# Patient Record
Sex: Male | Born: 1980 | Race: Black or African American | Hispanic: No | Marital: Married | Smoking: Former smoker
Health system: Southern US, Community
[De-identification: ages and names within clinical notes are randomized; demographics above are authoritative.]

## PROBLEM LIST (undated history)

## (undated) HISTORY — PX: NO PAST SURGERIES: SHX2092

---

## 2006-04-07 ENCOUNTER — Emergency Department (HOSPITAL_COMMUNITY): Admission: EM | Admit: 2006-04-07 | Discharge: 2006-04-07 | Payer: Self-pay | Admitting: Emergency Medicine

## 2011-04-29 ENCOUNTER — Encounter (HOSPITAL_COMMUNITY): Payer: Self-pay

## 2011-04-29 ENCOUNTER — Emergency Department (HOSPITAL_COMMUNITY)
Admission: EM | Admit: 2011-04-29 | Discharge: 2011-04-29 | Disposition: A | Discharge status: Home / Self Care | Payer: 59 | Source: Home / Self Care | Attending: Emergency Medicine | Admitting: Emergency Medicine

## 2011-04-29 DIAGNOSIS — G43909 Migraine, unspecified, not intractable, without status migrainosus: Secondary | ICD-10-CM

## 2011-04-29 DIAGNOSIS — A0811 Acute gastroenteropathy due to Norwalk agent: Secondary | ICD-10-CM

## 2011-04-29 MED ORDER — SUMATRIPTAN SUCCINATE 50 MG PO TABS: 50.0000 mg | ORAL_TABLET | ORAL | Status: AC | PRN

## 2011-04-29 MED ORDER — ONDANSETRON 4 MG PO TBDP
ORAL_TABLET | ORAL | Status: AC
  Filled 2011-04-29: qty 2

## 2011-04-29 MED ORDER — ONDANSETRON 4 MG PO TBDP
8.0000 mg | ORAL_TABLET | Freq: Once | ORAL | Status: AC
  Administered 2011-04-29: 8 mg via ORAL

## 2011-04-29 MED ORDER — DIPHENOXYLATE-ATROPINE 2.5-0.025 MG PO TABS: 1.0000 | ORAL_TABLET | Freq: Four times a day (QID) | ORAL | Status: AC | PRN

## 2011-04-29 MED ORDER — ONDANSETRON 8 MG PO TBDP: 8.0000 mg | ORAL_TABLET | Freq: Three times a day (TID) | ORAL | Status: AC | PRN

## 2011-04-29 NOTE — Discharge Instructions (Signed)

## 2011-04-29 NOTE — ED Notes (Signed)
Pt. C/o N/V since yesterday PM.  States his son had a stomach virus last week. Reports vomiting X3 with low grade fever. Been drinking fluids and took zofran today at 0400. States he has generalized body aches and HA.

## 2011-04-29 NOTE — ED Provider Notes (Signed)
Chief Complaint  Patient presents with  . Nausea    History of Present Illness:   The patient is a 31 year old male who since this morning has had nausea, vomiting, diarrhea, and moderate, crampy, periumbilical pain, has felt feverish, and chills. He denies any blood in the vomitus or stool. No bilious emesis. No specific exposures, suspicious ingestions, foreign travel, or animal exposure.  Review of Systems:  Other than noted above, the patient denies any of the following symptoms: Systemic:  No fevers, chills, sweats, weight loss or gain, fatigue, or tiredness. ENT:  No nasal congestion, rhinorrhea, or sore throat. Lungs:  No cough, wheezing, or shortness of breath. Cardiac:  No chest pain, syncope, or presyncope. GI:  No abdominal pain, nausea, vomiting, anorexia, diarrhea, constipation, blood in stool or vomitus. GU:  No dysuria, frequency, or urgency.  PMFSH:  Past medical history, family history, social history, meds, and allergies were reviewed.  Physical Exam:   Vital signs:  BP 104/60  Pulse 98  Temp(Src) 98.4 F (36.9 C) (Oral)  Resp 14  SpO2 98% General:  Alert and oriented.  In no distress.  Skin warm and dry.  Good skin turgor, brisk capillary refill. ENT:  No scleral icterus, moist mucous membranes, no oral lesions, pharynx clear. Lungs:  Breath sounds clear and equal bilaterally.  No wheezes, rales, or rhonchi. Heart:  Rhythm regular, without extrasystoles.  No gallops or murmers. Abdomen: Soft, flat, nontender, nondistended. No organomegaly or mass. Bowel sounds were normally active. No tenderness, guarding, or rebound. Skin: Clear, warm, and dry.  Assessment:   Diagnoses that have been ruled out:  None  Diagnoses that are still under consideration:  None  Final diagnoses:  Gastroenteritis due to norovirus  Migraines    Plan:   1.  The following meds were prescribed:   New Prescriptions   DIPHENOXYLATE-ATROPINE (LOMOTIL) 2.5-0.025 MG PER TABLET    Take 1  tablet by mouth 4 (four) times daily as needed for diarrhea or loose stools.   ONDANSETRON (ZOFRAN ODT) 8 MG DISINTEGRATING TABLET    Take 1 tablet (8 mg total) by mouth every 8 (eight) hours as needed for nausea.   SUMATRIPTAN (IMITREX) 50 MG TABLET    Take 1 tablet (50 mg total) by mouth every 2 (two) hours as needed for migraine.   2.  The patient was instructed in symptomatic care and handouts were given. 3.  The patient was told to return if becoming worse in any way, if no better in 2 or 3 days, and given some red flag symptoms that would indicate earlier return. 4.  The patient was told to take only sips of clear liquids for the next 24 hours and then advance to a b.r.a.t. Diet.      Reuben Likes, MD 04/29/11 2227

## 2011-04-29 NOTE — ED Notes (Signed)
Prescription given with discharge instructions.  

## 2015-10-18 ENCOUNTER — Emergency Department (HOSPITAL_BASED_OUTPATIENT_CLINIC_OR_DEPARTMENT_OTHER): Payer: 59

## 2015-10-18 ENCOUNTER — Emergency Department (HOSPITAL_BASED_OUTPATIENT_CLINIC_OR_DEPARTMENT_OTHER)
Admission: EM | Admit: 2015-10-18 | Discharge: 2015-10-18 | Disposition: A | Discharge status: Home / Self Care | Payer: 59 | Attending: Emergency Medicine | Admitting: Emergency Medicine

## 2015-10-18 ENCOUNTER — Encounter (HOSPITAL_BASED_OUTPATIENT_CLINIC_OR_DEPARTMENT_OTHER): Payer: Self-pay | Admitting: Respiratory Therapy

## 2015-10-18 DIAGNOSIS — Z87891 Personal history of nicotine dependence: Secondary | ICD-10-CM | POA: Diagnosis not present

## 2015-10-18 DIAGNOSIS — J111 Influenza due to unidentified influenza virus with other respiratory manifestations: Secondary | ICD-10-CM

## 2015-10-18 DIAGNOSIS — J069 Acute upper respiratory infection, unspecified: Secondary | ICD-10-CM | POA: Diagnosis not present

## 2015-10-18 DIAGNOSIS — J988 Other specified respiratory disorders: Secondary | ICD-10-CM

## 2015-10-18 DIAGNOSIS — R69 Illness, unspecified: Secondary | ICD-10-CM

## 2015-10-18 DIAGNOSIS — R509 Fever, unspecified: Secondary | ICD-10-CM | POA: Diagnosis present

## 2015-10-18 DIAGNOSIS — J45909 Unspecified asthma, uncomplicated: Secondary | ICD-10-CM | POA: Diagnosis not present

## 2015-10-18 DIAGNOSIS — B9789 Other viral agents as the cause of diseases classified elsewhere: Secondary | ICD-10-CM

## 2015-10-18 MED ORDER — ALBUTEROL SULFATE HFA 108 (90 BASE) MCG/ACT IN AERS
2.0000 | INHALATION_SPRAY | Freq: Once | RESPIRATORY_TRACT | Status: AC
  Administered 2015-10-18: 2 via RESPIRATORY_TRACT
  Filled 2015-10-18: qty 6.7

## 2015-10-18 MED ORDER — IBUPROFEN 200 MG PO TABS
600.0000 mg | ORAL_TABLET | Freq: Once | ORAL | Status: AC
  Administered 2015-10-18: 600 mg via ORAL
  Filled 2015-10-18: qty 1

## 2015-10-18 NOTE — ED Triage Notes (Signed)
Flu like symptoms since Monday. Fever, body aches, cough. Took robitussin and mucinex at 730 this morning.

## 2015-10-18 NOTE — ED Provider Notes (Signed)
[facility] Provider Note   CSN: [id number] Arrival date & time: 10/18/15  0859     History   Chief Complaint Chief Complaint  Patient presents with  . Fever    HPI Jeremy Cooley is a 35 y.o. male.  HPI  35 year old male with history of asthma presenting with a flulike illness. 2 nights ago he developed congestion, body aches, cough. Had a sore throat yesterday but none now. Has been taking Robitussin and Mucinex. This morning had a fever in the middle the night. He is also having some shortness of breath. He does not typically have frequent asthma exacerbations except during the winter. There is no headache or chest pain. No neck pain. Cough has brown sputum.  Past Medical History:  Diagnosis Date  . Asthma     There are no active problems to display for this patient.   History reviewed. No pertinent surgical history.     Home Medications    Prior to Admission medications   Not on File    Family History Family History  Problem Relation Age of Onset  . Diabetes Other   . Hypertension Other     Social History Social History  Substance Use Topics  . Smoking status: Former Games developermoker  . Smokeless tobacco: Never Used  . Alcohol use 1.8 oz/week    3 Cans of beer per week     Comment: occasional     Allergies   Review of patient's allergies indicates no known allergies.   Review of Systems Review of Systems  Constitutional: Positive for fever.  HENT: Positive for congestion and sore throat.   Respiratory: Positive for cough and shortness of breath.   Cardiovascular: Negative for chest pain.  Gastrointestinal: Negative for vomiting.  Musculoskeletal: Positive for myalgias.  Neurological: Negative for headaches.  All other systems reviewed and are negative.    Physical Exam Updated Vital Signs BP 131/96 (BP Location: Right Arm)   Pulse 96   Temp 99.7 F (37.6 C)   Resp 16   Ht 5\' 10"  (1.778 m)   Wt 210 lb (95.3 kg)   SpO2 97%   BMI  30.13 kg/m   Physical Exam  Constitutional: He is oriented to person, place, and time. He appears well-developed and well-nourished.  HENT:  Head: Normocephalic and atraumatic.  Right Ear: Tympanic membrane and external ear normal.  Left Ear: Tympanic membrane and external ear normal.  Nose: Nose normal.  Mouth/Throat: Oropharynx is clear and moist.  Eyes: Right eye exhibits no discharge. Left eye exhibits no discharge.  Neck: Neck supple.  Cardiovascular: Normal rate, regular rhythm and normal heart sounds.   Pulmonary/Chest: Effort normal and breath sounds normal.  Faint intermittent inspiratory wheezes  Abdominal: He exhibits no distension.  Musculoskeletal: He exhibits no edema.  Neurological: He is alert and oriented to person, place, and time.  Skin: Skin is warm and dry.  Nursing note and vitals reviewed.    ED Treatments / Results  Labs (all labs ordered are listed, but only abnormal results are displayed) Labs Reviewed - No data to display  EKG  EKG Interpretation None       Radiology Dg Chest 2 View  Result Date: 10/18/2015 CLINICAL DATA:  Cough since Monday.  Fever. EXAM: CHEST  2 VIEW COMPARISON:  None. FINDINGS: Heart and mediastinal contours are within normal limits. No focal opacities or effusions. No acute bony abnormality. IMPRESSION: No active cardiopulmonary disease. Electronically Signed   By: Charlett NoseKevin  Dover M.D.  On: 10/18/2015 09:39    Procedures Procedures (including critical care time)  Medications Ordered in ED Medications  ibuprofen (ADVIL,MOTRIN) tablet 600 mg (600 mg Oral Given 10/18/15 0916)  albuterol (PROVENTIL HFA;VENTOLIN HFA) 108 (90 Base) MCG/ACT inhaler 2 puff (2 puffs Inhalation Given 10/18/15 0941)     Initial Impression / Assessment and Plan / ED Course  I have reviewed the triage vital signs and the nursing notes.  Pertinent labs & imaging results that were available during my care of the patient were reviewed by me and  considered in my medical decision making (see chart for details).  Clinical Course  Comment By Time  Overall patient appears well. No signs of distress. Will get Xray to eval for PNA.  Pricilla Loveless, MD 09/13 [phone]    X-ray shows no pneumonia. Likely has a flulike or other viral respiratory illness. However he does not have a severe history of asthma or other concerning medical problems that would warrant Tamiflu. Discussed over-the-counter treatments and supportive care, given an albuterol inhaler given very mild wheezing and no albuterol at home. Discussed strict return precautions.  Final Clinical Impressions(s) / ED Diagnoses   Final diagnoses:  Influenza-like illness  Viral respiratory illness    New Prescriptions There are no discharge medications for this patient.    Pricilla Loveless, MD 10/18/15 1225

## 2016-06-27 ENCOUNTER — Encounter: Payer: Self-pay | Admitting: Allergy

## 2016-06-27 ENCOUNTER — Ambulatory Visit (INDEPENDENT_AMBULATORY_CARE_PROVIDER_SITE_OTHER): Payer: 59 | Admitting: Allergy

## 2016-06-27 VITALS — BP 132/84 | HR 85 | Temp 98.2°F | Ht 70.0 in | Wt 231.0 lb

## 2016-06-27 DIAGNOSIS — H1010 Acute atopic conjunctivitis, unspecified eye: Secondary | ICD-10-CM

## 2016-06-27 DIAGNOSIS — J309 Allergic rhinitis, unspecified: Secondary | ICD-10-CM

## 2016-06-27 DIAGNOSIS — J4550 Severe persistent asthma, uncomplicated: Secondary | ICD-10-CM

## 2016-06-27 MED ORDER — BUDESONIDE-FORMOTEROL FUMARATE 160-4.5 MCG/ACT IN AERO
2.0000 | INHALATION_SPRAY | Freq: Two times a day (BID) | RESPIRATORY_TRACT | 5 refills | Status: AC
Start: 2016-06-27 — End: ?

## 2016-06-27 MED ORDER — ALBUTEROL SULFATE HFA 108 (90 BASE) MCG/ACT IN AERS: INHALATION_SPRAY | RESPIRATORY_TRACT | 2 refills | Status: DC

## 2016-06-27 MED ORDER — OLOPATADINE HCL 0.7 % OP SOLN: 1.0000 [drp] | Freq: Every day | OPHTHALMIC | 5 refills | Status: DC | PRN

## 2016-06-27 NOTE — Progress Notes (Signed)
New Patient Note  RE: Jeremy Cooley MRN: [id number] DOB: 1980/12/02 Date of Office Visit: 06/27/2016  Referring provider: No ref. provider found Primary care provider: Patient, No Pcp Per  Chief Complaint: Trouble breathing  History of present illness: Jeremy Cooley is a 36 y.o. male presenting today for evaluation of trouble beathing.  He has a history of asthma diagnosed when he was a teenager.  He currently feels like he has been having an asthma flare up for the past couple of months.  He states he had the flu last fall and he hasn't seemed to recover from that.  He continues to have cough and wheezing that is worse at night and he does endorse nighttime awakenings most nights of the week. He has been using his albuterol at least daily for relief of symptoms. He has not been to urgent care or ED for these symptoms and has not been on any oral steroids. He reports that he has been using his albuterol so much that he ran out and has had to use his son's albuterol. He does recall as a child that his asthma was triggered by cold weather and he was on Advair discus. He feels it has been at least 10 years or so since he last used any controller medications.  He reports seasonal allergies worse in spring and summer.  He reports nasal congestion and draining as well as itchy, watery red eyes and sneezing.   He takes zyrtec as needed.    He reports sneezing is his biggest allergy complaint.    No history of eczema or food allergy.  He does not have a primary care provider at this time .  Review of systems: Review of Systems  Constitutional: Negative for chills, fever and malaise/fatigue.  HENT: Positive for congestion. Negative for ear discharge, ear pain, nosebleeds, sinus pain, sore throat and tinnitus.   Eyes: Positive for redness. Negative for pain and discharge.  Respiratory: Positive for cough, shortness of breath and wheezing. Negative for sputum production.   Cardiovascular: Negative  for chest pain.  Gastrointestinal: Negative for abdominal pain, diarrhea, heartburn, nausea and vomiting.  Musculoskeletal: Negative for joint pain and myalgias.  Skin: Negative for itching and rash.  Neurological: Negative for headaches.    All other systems negative unless noted above in HPI  Past medical history: Past Medical History:  Diagnosis Date  . Asthma     Past surgical history: Past Surgical History:  Procedure Laterality Date  . NO PAST SURGERIES      Family history:  Family History  Problem Relation Age of Onset  . Diabetes Other   . Hypertension Other   . Allergic rhinitis Father   . Asthma Father   . Angioedema Neg Hx   . Atopy Neg Hx   . Eczema Neg Hx   . Immunodeficiency Neg Hx   . Urticaria Neg Hx     Social history: He lives in a home with carpeting with gas heating and central cooling. There is a dog in the home. There is no concern for water damage, mildew or roaches in the home. He works as a Research officer, trade unionsenior provider data analyst  . Smoking status: Former Games developermoker  . Smokeless tobacco: Never Used  . Alcohol use 1.8 oz/week    3 Cans of beer per week     Comment: occasional  . Drug use: No   Medication List: Allergies as of 06/27/2016   No Known Allergies     Medication  List       Accurate as of 06/27/16 12:17 PM. Always use your most recent med list.          albuterol 108 (90 Base) MCG/ACT inhaler Commonly known as:  PROVENTIL HFA;VENTOLIN HFA Inhale into the lungs every 6 (six) hours as needed for wheezing or shortness of breath.   cetirizine 10 MG tablet Commonly known as:  ZYRTEC Take 10 mg by mouth daily.       Known medication allergies: No Known Allergies   Physical examination: Blood pressure 132/84, pulse 85, temperature 98.2 F (36.8 C), temperature source Oral, height 5\' 10"  (1.778 m), weight 231 lb (104.8 kg), SpO2 96 %.  General: Alert, interactive, in no acute distress. HEENT: TMs pearly gray, turbinates moderately  edematous with clear discharge, post-pharynx non erythematous. Neck: Supple without lymphadenopathy. Lungs: Mildly decreased breath sounds with expiratory wheezing bilaterally. {no increased work of breathing.  Decreased wheezing with improved aeration following albuterol neb treatment CV: Normal S1, S2 without murmurs. Abdomen: Nondistended, nontender. Skin: Warm and dry, without lesions or rashes. Extremities:  No clubbing, cyanosis or edema. Neuro:   Grossly intact.  Diagnositics/Labs: Labs/imaging: personally reviewed CXR from 10/2015 that was normal.   FINDINGS: Heart and mediastinal contours are within normal limits. No focal opacities or effusions. No acute bony abnormality.  IMPRESSION: No active cardiopulmonary disease  Spirometry: FEV1: 2.5L  68%, FVC: 4.32L  97% he had a 14% increase in FEV1 to 2.66 L or 77% following bronchodilator. This is a significant improvement .    Allergy testing: Declined    Assessment and plan:   Asthma, Severe persistent     - start Symbicort 2 puffs twice a day.  This is your everyday maintenance medication     - use albuterol 2 puffs every 4-6 hours a needed for cough, wheeze, shortness of breath or chest tightness.   Monitor frequency of use.    Asthma control goals:   Full participation in all desired activities (may need albuterol before activity)  Albuterol use two time or less a week on average (not counting use with activity)  Cough interfering with sleep two time or less a month  Oral steroids no more than once a year  No hospitalizations   Allergic rhinoconjunctivitis    - take Zyrtec 10mg  daily     - for nasal congestion/drainage use over-the-counter nasal steroid spray 2 spray each nostril daily    - for itchy, watery, red eyes use Pazeo 1 drop each eye daily as needed    - Recommend testing in the future  Follow-up 3 months or sooner if needed   I appreciate the opportunity to take part in Jeremy Cooley's care.  Please do not hesitate to contact me with questions.  Sincerely,   Margo Aye, MD Allergy/Immunology [facility]

## 2016-06-27 NOTE — Patient Instructions (Signed)
Asthma     - start Symbicort 160mcg 2 puffs twice a day.  This is your everyday maintenance medication     - use albuterol 2 puffs every 4-6 hours a needed for cough, wheeze, shortness of breath or chest tightness.   Monitor frequency of use.    Asthma control goals:   Full participation in all desired activities (may need albuterol before activity)  Albuterol use two time or less a week on average (not counting use with activity)  Cough interfering with sleep two time or less a month  Oral steroids no more than once a year  No hospitalizations   Allergies    - take Zyrtec 10mg  daily     - for nasal congestion/drainage use over-the-counter nasal steroid spray 2 spray each nostril daily    - for itchy, watery, red eyes use Pazeo 1 drop each eye daily as needed  Follow-up 3 months or sooner if needed

## 2016-06-28 ENCOUNTER — Telehealth: Payer: Self-pay

## 2016-06-28 MED ORDER — OLOPATADINE HCL 0.1 % OP SOLN: OPHTHALMIC | 5 refills | Status: DC

## 2016-06-28 NOTE — Telephone Encounter (Signed)
Script sent  

## 2016-06-28 NOTE — Telephone Encounter (Signed)
Lets change to patanol 1 drop each eye twice daily as needed for itchy, watery, red eyes.   thanks

## 2016-06-28 NOTE — Telephone Encounter (Signed)
Received fax from [facility] in regards to an alternative for Pazeo. The alternatives are lastacaf, Pensions consultantpatanol,or Zaditor. Please advise with instructions.

## 2016-06-28 NOTE — Addendum Note (Signed)
Addended by: Marthann SchillerLIPFORD, JENNIFER C on: 06/28/2016 08:50 AM   Modules accepted: Orders

## 2017-07-17 ENCOUNTER — Ambulatory Visit: Payer: 59 | Admitting: Family Medicine

## 2017-07-17 DIAGNOSIS — Z0289 Encounter for other administrative examinations: Secondary | ICD-10-CM

## 2017-12-05 ENCOUNTER — Emergency Department (HOSPITAL_COMMUNITY): Payer: 59

## 2017-12-05 ENCOUNTER — Other Ambulatory Visit: Payer: Self-pay

## 2017-12-05 ENCOUNTER — Encounter (HOSPITAL_COMMUNITY): Payer: Self-pay | Admitting: Emergency Medicine

## 2017-12-05 ENCOUNTER — Emergency Department (HOSPITAL_COMMUNITY)
Admission: EM | Admit: 2017-12-05 | Discharge: 2017-12-05 | Disposition: A | Discharge status: Home / Self Care | Payer: 59 | Attending: Emergency Medicine | Admitting: Emergency Medicine

## 2017-12-05 DIAGNOSIS — J45909 Unspecified asthma, uncomplicated: Secondary | ICD-10-CM | POA: Insufficient documentation

## 2017-12-05 DIAGNOSIS — Z79899 Other long term (current) drug therapy: Secondary | ICD-10-CM | POA: Insufficient documentation

## 2017-12-05 DIAGNOSIS — Z87891 Personal history of nicotine dependence: Secondary | ICD-10-CM | POA: Insufficient documentation

## 2017-12-05 DIAGNOSIS — R0789 Other chest pain: Secondary | ICD-10-CM

## 2017-12-05 DIAGNOSIS — R079 Chest pain, unspecified: Secondary | ICD-10-CM | POA: Diagnosis present

## 2017-12-05 LAB — I-STAT TROPONIN, ED
TROPONIN I, POC: 0 ng/mL (ref 0.00–0.08)
TROPONIN I, POC: 0 ng/mL (ref 0.00–0.08)

## 2017-12-05 LAB — BASIC METABOLIC PANEL
ANION GAP: 9 (ref 5–15)
BUN: 11 mg/dL (ref 6–20)
CO2: 24 mmol/L (ref 22–32)
Calcium: 9.3 mg/dL (ref 8.9–10.3)
Chloride: 106 mmol/L (ref 98–111)
Creatinine, Ser: 0.93 mg/dL (ref 0.61–1.24)
Glucose, Bld: 110 mg/dL — ABNORMAL HIGH (ref 70–99)
Potassium: 3.7 mmol/L (ref 3.5–5.1)
SODIUM: 139 mmol/L (ref 135–145)

## 2017-12-05 LAB — CBC
HCT: 44.3 % (ref 39.0–52.0)
Hemoglobin: 14.7 g/dL (ref 13.0–17.0)
MCH: 29.5 pg (ref 26.0–34.0)
MCHC: 33.2 g/dL (ref 30.0–36.0)
MCV: 89 fL (ref 80.0–100.0)
NRBC: 0 % (ref 0.0–0.2)
Platelets: 344 10*3/uL (ref 150–400)
RBC: 4.98 MIL/uL (ref 4.22–5.81)
RDW: 13.2 % (ref 11.5–15.5)
WBC: 6.9 10*3/uL (ref 4.0–10.5)

## 2017-12-05 LAB — D-DIMER, QUANTITATIVE (NOT AT ARMC)

## 2017-12-05 MED ORDER — MELOXICAM 15 MG PO TABS: 15.0000 mg | ORAL_TABLET | Freq: Every day | ORAL | 0 refills | Status: DC

## 2017-12-05 NOTE — ED Notes (Signed)
Patient verbalizes understanding of discharge instructions. Opportunity for questioning and answers were provided. Pt discharged from ED. 

## 2017-12-05 NOTE — Discharge Instructions (Addendum)
You have been diagnosed by your caregiver as having chest wall pain. °SEEK IMMEDIATE MEDICAL ATTENTION IF: °You develop a fever.  °Your chest pains become severe or intolerable.  °You develop new, unexplained symptoms (problems).  °You develop shortness of breath, nausea, vomiting, sweating or feel light headed.  °You develop a new cough or you cough up blood. ° °

## 2017-12-05 NOTE — ED Provider Notes (Signed)
[facility] EMERGENCY DEPARTMENT Provider Note   CSN: [id number] Arrival date & time: 12/05/17  4098     History   Chief Complaint Chief Complaint  Patient presents with  . Chest Pain    HPI Jeremy Cooley is a 37 y.o. male.  Who presents the emergency department chief complaint of chest pain.  Patient states that last night he began having pain on the left side of his chest under the left nipple on the anterior chest wall and in the back of his shoulder blade.  Pain was made worse with movement of the left arm.  He states that he had significant difficulty sleeping.  He also complained that the pain was worse when lying flat or on the left side better with sitting upright or lying toward the right side.  He denies any recent heavy lifting or new exercise routine.  The patient does not have a history of high blood pressure, high cholesterol or smoking.  He denies a family history of coronary artery disease or MI.  He denies pleuritic chest pain, hemoptysis, fevers or chills.  He has no unilateral leg swelling, history of DVT, PE recent confinement injuries or surgeries.  HPI  Past Medical History:  Diagnosis Date  . Asthma     There are no active problems to display for this patient.   Past Surgical History:  Procedure Laterality Date  . NO PAST SURGERIES          Home Medications    Prior to Admission medications   Medication Sig Start Date End Date Taking? Authorizing Provider  albuterol (PROVENTIL HFA;VENTOLIN HFA) 108 (90 Base) MCG/ACT inhaler Inhale 2 puffs every 4-6 hours as needed for shortness of breath or wheezing. 06/27/16   Marcelyn Bruins, MD  budesonide-formoterol [facility]) 160-4.5 MCG/ACT inhaler Inhale 2 puffs into the lungs 2 (two) times daily. 06/27/16   Marcelyn Bruins, MD  cetirizine (ZYRTEC) 10 MG tablet Take 10 mg by mouth daily.    [provider]  olopatadine (PATANOL) 0.1 % ophthalmic solution 1 drop in  both eyes twice a day as needed for itchy watery eyes. 06/28/16   Marcelyn Bruins, MD    Family History Family History  Problem Relation Age of Onset  . Diabetes Other   . Hypertension Other   . Allergic rhinitis Father   . Asthma Father   . Angioedema Neg Hx   . Atopy Neg Hx   . Eczema Neg Hx   . Immunodeficiency Neg Hx   . Urticaria Neg Hx     Social History Social History   Tobacco Use  . Smoking status: Former Games developer  . Smokeless tobacco: Never Used  Substance Use Topics  . Alcohol use: Yes    Alcohol/week: 3.0 standard drinks    Types: 3 Cans of beer per week    Comment: occasional  . Drug use: No     Allergies   Patient has no known allergies.   Review of Systems Review of Systems Ten systems reviewed and are negative for acute change, except as noted in the HPI.    Physical Exam Updated Vital Signs BP 112/84 (BP Location: Right Arm)   Pulse 70   Temp 98.4 F (36.9 C) (Oral)   Resp 17   Ht 5\' 10"  (1.778 m)   Wt 104.3 kg   SpO2 98%   BMI 33.00 kg/m   Physical Exam  Constitutional: He appears well-developed and well-nourished. No distress.  HENT:  Head: Normocephalic and atraumatic.  Eyes: Conjunctivae are normal. No scleral icterus.  Neck: Normal range of motion. Neck supple.  Cardiovascular: Normal rate, regular rhythm, normal heart sounds and normal pulses.  Pulmonary/Chest: Effort normal and breath sounds normal. No respiratory distress. He has no wheezes. He has no rhonchi. He has no rales. He exhibits no tenderness.  Abdominal: Soft. There is no tenderness.  Musculoskeletal: He exhibits no edema.  Neurological: He is alert.  Skin: Skin is warm and dry. He is not diaphoretic.  Psychiatric: His behavior is normal.  Nursing note and vitals reviewed.    ED Treatments / Results  Labs (all labs ordered are listed, but only abnormal results are displayed) Labs Reviewed  BASIC METABOLIC PANEL - Abnormal; Notable for the following  components:      Result Value   Glucose, Bld 110 (*)    All other components within normal limits  CBC  I-STAT TROPONIN, ED    EKG EKG Interpretation  Date/Time:  Friday December 05 2017 09:26:27 EDT Ventricular Rate:  82 PR Interval:  140 QRS Duration: 102 QT Interval:  384 QTC Calculation: 448 R Axis:   82 Text Interpretation:  Normal sinus rhythm Normal ECG Confirmed by Tilden Fossa [phone]) on 12/05/2017 9:49:38 AM   Radiology Dg Chest 2 View  Result Date: 12/05/2017 CLINICAL DATA:  37 year old male with left side chest pain radiating to the lower ribs since last night. Symptoms increase with movement. No known injury. EXAM: CHEST - 2 VIEW COMPARISON:  Chest radiographs 10/18/2015. FINDINGS: Mildly lower lung volumes with mild crowding of markings suspected in both lungs. Mediastinal contours remain normal. Visualized tracheal air column is within normal limits. No pneumothorax, pulmonary edema, pleural effusion or confluent pulmonary opacity. No osseous abnormality identified. Negative visible bowel gas pattern IMPRESSION: Lower lung volumes, otherwise negative - no cardiopulmonary abnormality. Electronically Signed   By: Odessa Fleming M.D.   On: 12/05/2017 09:59    Procedures Procedures (including critical care time)  Medications Ordered in ED Medications - No data to display   Initial Impression / Assessment and Plan / ED Course  I have reviewed the triage vital signs and the nursing notes.  Pertinent labs & imaging results that were available during my care of the patient were reviewed by me and considered in my medical decision making (see chart for details).     This is a 37 year old male with chest pain.  Worse with movement of the arm.  Not reproducible with palpation of the chest wall.  Patient's EKG shows normal sinus rhythm without any abnormalities.  His chest x-ray is reviewed by me and shows no abnormalities as well.  He has a heart score of 0.  His d-dimer is  negative.  I feel that his pain is likely chest wall pain and musculoskeletal in nature.  I also consider pericarditis although without diffuse ST segment elevation this may very unlikely however treatment will be the same with anti-inflammatory medications.  Patient appears appropriate for discharge at this time.  I discussed return precautions. Final Clinical Impressions(s) / ED Diagnoses   Final diagnoses:  None    ED Discharge Orders    None       Arthor Captain, PA-C 12/05/17 1617    Tilden Fossa, MD 12/07/17 [phone]

## 2017-12-05 NOTE — ED Triage Notes (Signed)
Pt presents with left lateral side chest pain since yesterday no radiation but when he moves he feels it in his shoulder blade. No shortness or breath noted. He denies cardiac hx. The patient is alert and oriented x4 with no acute distress at triage.

## 2017-12-05 NOTE — ED Notes (Signed)
Lab adding on D dimer to specimens already corrected.

## 2020-02-21 IMAGING — CR DG CHEST 2V
2 series · 2 of 2 positions shown · non-contrast
Comparison: Chest radiographs 10/18/2015.

CLINICAL DATA: 37-year-old male with left side chest pain radiating
to the lower ribs since last night. Symptoms increase with movement.
No known injury.

EXAM:
CHEST - 2 VIEW

[chest pa]
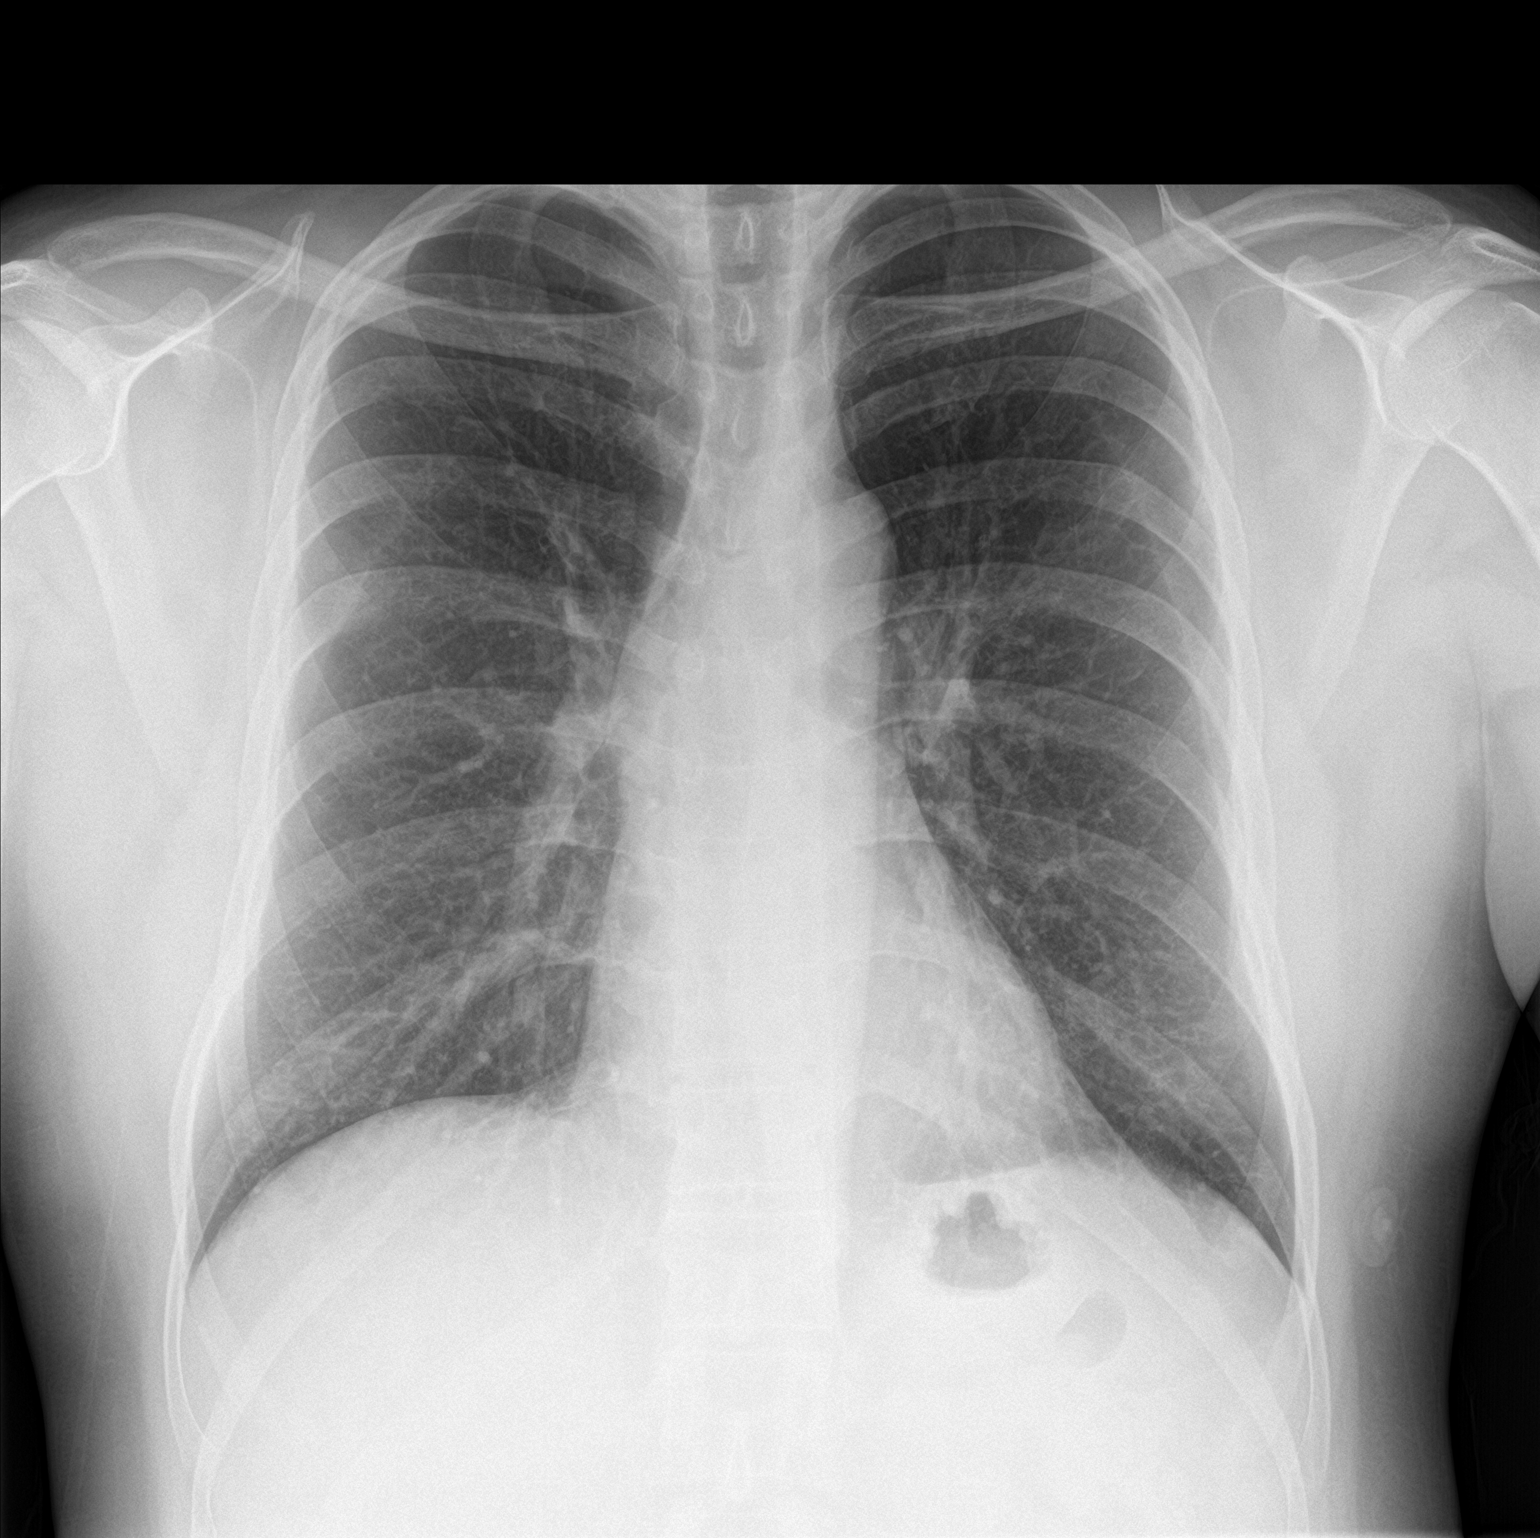

[chest lat]
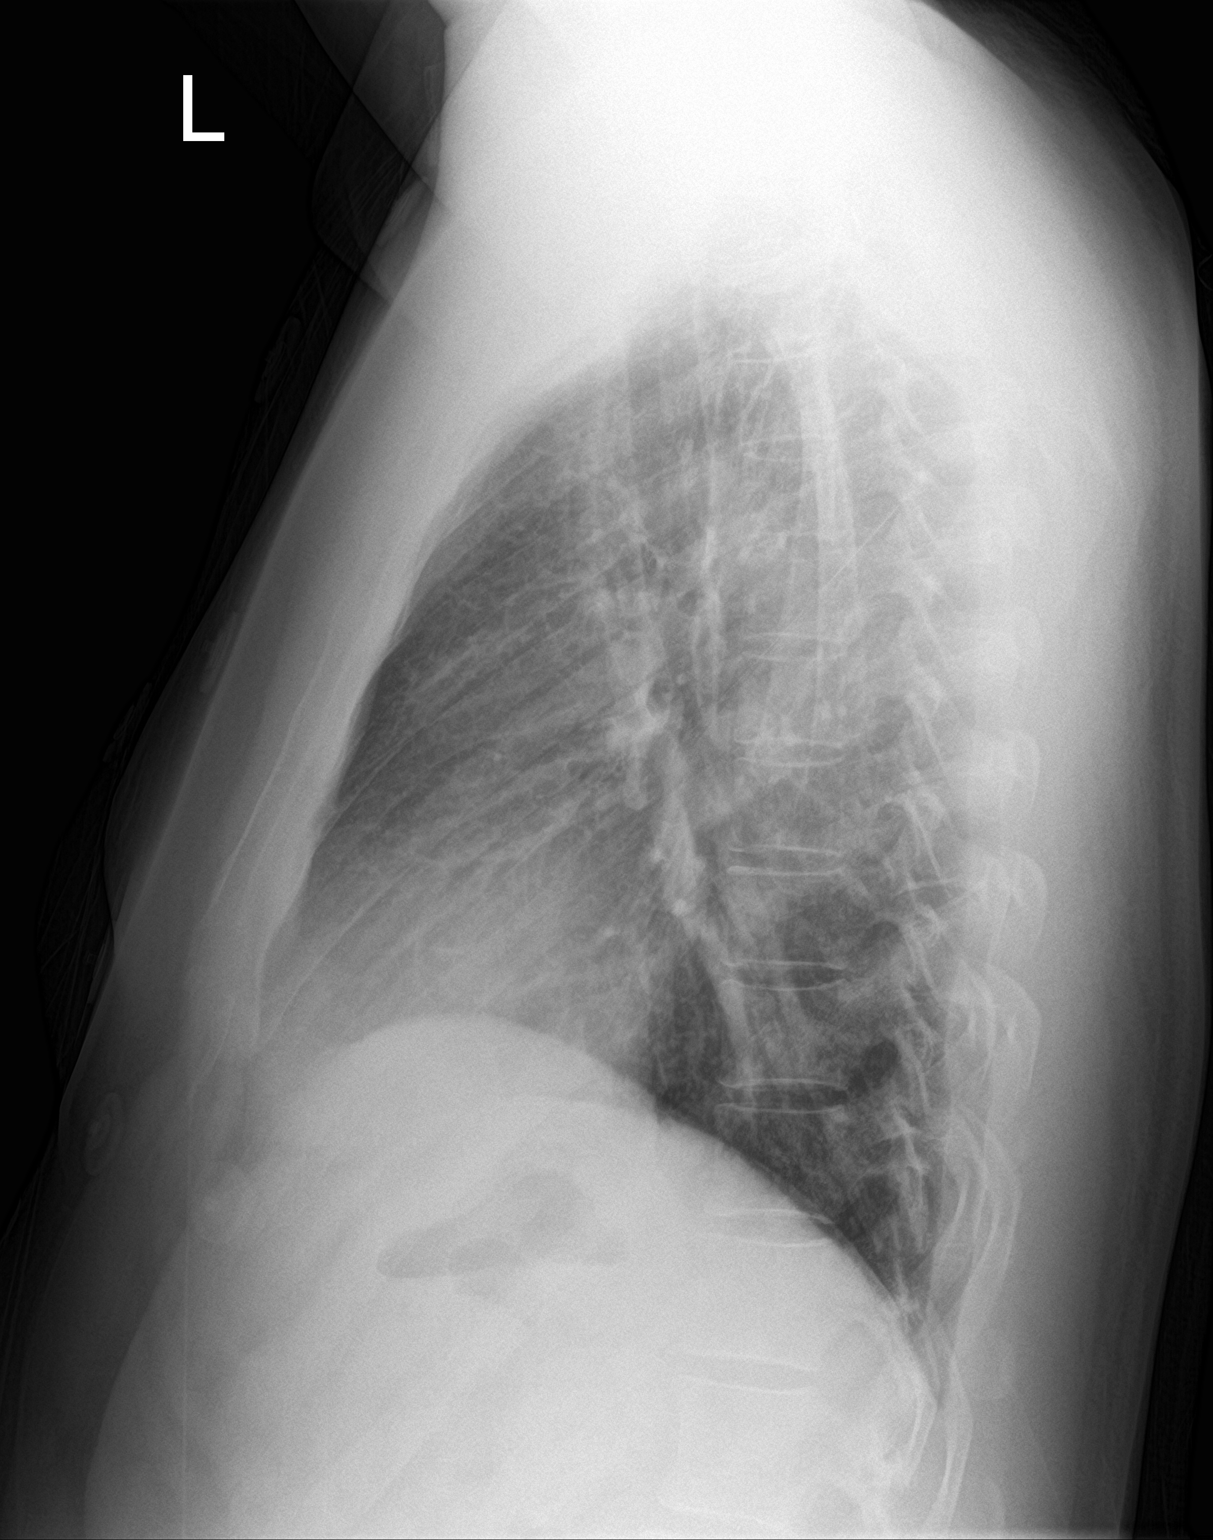

[2 of 2 positions shown; findings below may reference images not displayed]

FINDINGS: Mildly lower lung volumes with mild crowding of markings suspected
in both lungs. Mediastinal contours remain normal. Visualized
tracheal air column is within normal limits. No pneumothorax,
pulmonary edema, pleural effusion or confluent pulmonary opacity. No
osseous abnormality identified. Negative visible bowel gas pattern
IMPRESSION: Lower lung volumes, otherwise negative - no cardiopulmonary
abnormality.

## 2023-07-26 ENCOUNTER — Encounter (HOSPITAL_BASED_OUTPATIENT_CLINIC_OR_DEPARTMENT_OTHER): Payer: Self-pay | Admitting: *Deleted

## 2023-07-26 ENCOUNTER — Other Ambulatory Visit: Payer: Self-pay

## 2023-07-26 ENCOUNTER — Emergency Department (HOSPITAL_BASED_OUTPATIENT_CLINIC_OR_DEPARTMENT_OTHER)
Admission: EM | Admit: 2023-07-26 | Discharge: 2023-07-26 | Disposition: A | Discharge status: Home / Self Care | Source: Home / Self Care | Attending: Emergency Medicine | Admitting: Emergency Medicine

## 2023-07-26 DIAGNOSIS — N3001 Acute cystitis with hematuria: Secondary | ICD-10-CM | POA: Insufficient documentation

## 2023-07-26 DIAGNOSIS — Z87891 Personal history of nicotine dependence: Secondary | ICD-10-CM | POA: Diagnosis not present

## 2023-07-26 DIAGNOSIS — D72829 Elevated white blood cell count, unspecified: Secondary | ICD-10-CM | POA: Insufficient documentation

## 2023-07-26 DIAGNOSIS — B9620 Unspecified Escherichia coli [E. coli] as the cause of diseases classified elsewhere: Secondary | ICD-10-CM | POA: Diagnosis not present

## 2023-07-26 DIAGNOSIS — Z7951 Long term (current) use of inhaled steroids: Secondary | ICD-10-CM | POA: Insufficient documentation

## 2023-07-26 DIAGNOSIS — G43909 Migraine, unspecified, not intractable, without status migrainosus: Secondary | ICD-10-CM | POA: Diagnosis not present

## 2023-07-26 DIAGNOSIS — R509 Fever, unspecified: Secondary | ICD-10-CM | POA: Diagnosis present

## 2023-07-26 DIAGNOSIS — J45909 Unspecified asthma, uncomplicated: Secondary | ICD-10-CM | POA: Insufficient documentation

## 2023-07-26 DIAGNOSIS — N413 Prostatocystitis: Secondary | ICD-10-CM | POA: Diagnosis not present

## 2023-07-26 DIAGNOSIS — F1090 Alcohol use, unspecified, uncomplicated: Secondary | ICD-10-CM | POA: Diagnosis not present

## 2023-07-26 DIAGNOSIS — Z79899 Other long term (current) drug therapy: Secondary | ICD-10-CM | POA: Diagnosis not present

## 2023-07-26 LAB — URINALYSIS, W/ REFLEX TO CULTURE (INFECTION SUSPECTED)
Bilirubin Urine: NEGATIVE
Glucose, UA: NEGATIVE mg/dL
Ketones, ur: 15 mg/dL — AB
Nitrite: NEGATIVE
Protein, ur: 30 mg/dL — AB
RBC / HPF: >50 RBC/hpf (ref 0–5)
Specific Gravity, Urine: 1.031 — ABNORMAL HIGH (ref 1.005–1.030)
WBC, UA: >50 WBC/hpf (ref 0–5)
pH: 6 (ref 5.0–8.0)

## 2023-07-26 LAB — CBC WITH DIFFERENTIAL/PLATELET
Abs Immature Granulocytes: 0.08 10*3/uL — ABNORMAL HIGH (ref 0.00–0.07)
Basophils Absolute: 0 10*3/uL (ref 0.0–0.1)
Basophils Relative: 0 %
Eosinophils Absolute: 0 10*3/uL (ref 0.0–0.5)
Eosinophils Relative: 0 %
HCT: 41 % (ref 39.0–52.0)
Hemoglobin: 13.7 g/dL (ref 13.0–17.0)
Immature Granulocytes: 1 %
Lymphocytes Relative: 7 %
Lymphs Abs: 1.2 10*3/uL (ref 0.7–4.0)
MCH: 29.6 pg (ref 26.0–34.0)
MCHC: 33.4 g/dL (ref 30.0–36.0)
MCV: 88.6 fL (ref 80.0–100.0)
Monocytes Absolute: 1.4 10*3/uL — ABNORMAL HIGH (ref 0.1–1.0)
Monocytes Relative: 9 %
Neutro Abs: 13.3 10*3/uL — ABNORMAL HIGH (ref 1.7–7.7)
Neutrophils Relative %: 83 %
Platelets: 259 10*3/uL (ref 150–400)
RBC: 4.63 MIL/uL (ref 4.22–5.81)
RDW: 13.3 % (ref 11.5–15.5)
WBC: 16 10*3/uL — ABNORMAL HIGH (ref 4.0–10.5)
nRBC: 0 % (ref 0.0–0.2)

## 2023-07-26 LAB — COMPREHENSIVE METABOLIC PANEL WITH GFR
ALT: 25 U/L (ref 0–44)
AST: 22 U/L (ref 15–41)
Albumin: 4.4 g/dL (ref 3.5–5.0)
Alkaline Phosphatase: 70 U/L (ref 38–126)
Anion gap: 14 (ref 5–15)
BUN: 11 mg/dL (ref 6–20)
CO2: 22 mmol/L (ref 22–32)
Calcium: 9.7 mg/dL (ref 8.9–10.3)
Chloride: 100 mmol/L (ref 98–111)
Creatinine, Ser: 1 mg/dL (ref 0.61–1.24)
GFR, Estimated: >60 mL/min (ref >60)
Glucose, Bld: 119 mg/dL — ABNORMAL HIGH (ref 70–99)
Potassium: 4 mmol/L (ref 3.5–5.1)
Sodium: 136 mmol/L (ref 135–145)
Total Bilirubin: 1.3 mg/dL — ABNORMAL HIGH (ref 0.0–1.2)
Total Protein: 8 g/dL (ref 6.5–8.1)

## 2023-07-26 LAB — LACTIC ACID, PLASMA: Lactic Acid, Venous: 1.2 mmol/L (ref 0.5–1.9)

## 2023-07-26 MED ORDER — ONDANSETRON HCL 4 MG/2ML IJ SOLN
4.0000 mg | Freq: Once | INTRAMUSCULAR | Status: AC
  Administered 2023-07-26: 4 mg via INTRAVENOUS
  Filled 2023-07-26: qty 2

## 2023-07-26 MED ORDER — MORPHINE SULFATE (PF) 2 MG/ML IV SOLN
2.0000 mg | Freq: Once | INTRAVENOUS | Status: AC
  Administered 2023-07-26: 2 mg via INTRAVENOUS
  Filled 2023-07-26: qty 1

## 2023-07-26 MED ORDER — KETOROLAC TROMETHAMINE 15 MG/ML IJ SOLN
15.0000 mg | Freq: Once | INTRAMUSCULAR | Status: AC
Start: 2023-07-26 — End: 2023-07-26
  Administered 2023-07-26: 15 mg via INTRAVENOUS
  Filled 2023-07-26: qty 1

## 2023-07-26 MED ORDER — ONDANSETRON 4 MG PO TBDP: 4.0000 mg | ORAL_TABLET | Freq: Three times a day (TID) | ORAL | 0 refills | Status: AC | PRN

## 2023-07-26 MED ORDER — CIPROFLOXACIN IN D5W 400 MG/200ML IV SOLN
400.0000 mg | Freq: Once | INTRAVENOUS | Status: AC
  Administered 2023-07-26: 400 mg via INTRAVENOUS
  Filled 2023-07-26: qty 200

## 2023-07-26 MED ORDER — ACETAMINOPHEN 325 MG PO TABS
650.0000 mg | ORAL_TABLET | Freq: Once | ORAL | Status: AC
  Administered 2023-07-26: 650 mg via ORAL
  Filled 2023-07-26: qty 2

## 2023-07-26 MED ORDER — CIPROFLOXACIN HCL 500 MG PO TABS: 500.0000 mg | ORAL_TABLET | Freq: Two times a day (BID) | ORAL | 0 refills | Status: DC

## 2023-07-26 NOTE — ED Provider Notes (Signed)
 [place] EMERGENC[facility] DEPARTMENT AT [facility] Provider Note   CSN: [id number] Arrival date & time: 07/26/23  1225     Patient presents with: Dysuria   Jeremy Cooley is a 43 y.o. male.  With past medical history of asthma is presenting to emergency room with 4 days of foul-smelling urine, dysuria, fever.  Had Tmax of 102.7 at home.  Fever comes down with Tylenol .  Denies chest pain shortness of breath cough, abdominal pain or back pain. No history of UTI, notes holding urine in for extended period of time due to recent travel to [place]. Denies penile drainage, denies STD concern.  Denies receding or current back, abdominal or flank pain.    Dysuria Presenting symptoms: dysuria   Presenting symptoms: no penile discharge, no penile pain, no scrotal pain and no swelling        Prior to Admission medications   Medication Sig Start Date End Date Taking? Authorizing Provider  albuterol  (PROVENTIL  HFA;VENTOLIN  HFA) 108 (90 Base) MCG/ACT inhaler Inhale 2 puffs every 4-6 hours as needed for shortness of breath or wheezing. Patient taking differently: Inhale 2 puffs into the lungs every 4 (four) hours as needed for wheezing or shortness of breath.  06/27/16   Padgett, Danita Macintosh, MD  budesonide -formoterol  (SYMBICORT ) 160-4.5 MCG/ACT inhaler Inhale 2 puffs into the lungs 2 (two) times daily. Patient taking differently: Inhale 2 puffs into the lungs 2 (two) times daily as needed (shortness of breath).  06/27/16   Jeneal Danita Macintosh, MD  cetirizine (ZYRTEC) 10 MG tablet Take 10 mg by mouth daily.    [provider]  meloxicam  (MOBIC ) 15 MG tablet Take 1 tablet (15 mg total) by mouth daily. Take 1 daily with food. 12/05/17   Harris, Abigail, PA-C  olopatadine  (PATANOL) 0.1 % ophthalmic solution 1 drop in both eyes twice a day as needed for itchy watery eyes. Patient taking differently: Place 1 drop into both eyes 2 (two) times daily as needed for allergies.  06/28/16    Jeneal Danita Macintosh, MD    Allergies: Patient has no known allergies.    Review of Systems  Genitourinary:  Positive for dysuria. Negative for penile discharge and penile pain.    Updated Vital Signs BP 127/89   Pulse 84   Temp 100.2 F (37.9 C) (Oral)   Resp 18   SpO2 96%   Physical Exam Vitals and nursing note reviewed.  Constitutional:      General: He is not in acute distress.    Appearance: He is not toxic-appearing.  HENT:     Head: Normocephalic and atraumatic.   Eyes:     General: No scleral icterus.    Conjunctiva/sclera: Conjunctivae normal.    Cardiovascular:     Rate and Rhythm: Normal rate and regular rhythm.     Pulses: Normal pulses.     Heart sounds: Normal heart sounds.  Pulmonary:     Effort: Pulmonary effort is normal. No respiratory distress.     Breath sounds: Normal breath sounds.  Abdominal:     General: Abdomen is flat. Bowel sounds are normal.     Palpations: Abdomen is soft.     Tenderness: There is no abdominal tenderness. There is no right CVA tenderness or left CVA tenderness.     Comments: No CVA tenderness.  No abdominal tenderness.  Abdomen is soft and nondistended.   Musculoskeletal:     Right lower leg: No edema.     Left lower leg: No edema.  Skin:    General: Skin is warm and dry.     Findings: No lesion.   Neurological:     General: No focal deficit present.     Mental Status: He is alert and oriented to person, place, and time. Mental status is at baseline.     (all labs ordered are listed, but only abnormal results are displayed) Labs Reviewed  COMPREHENSIVE METABOLIC PANEL WITH GFR - Abnormal; Notable for the following components:      Result Value   Glucose, Bld 119 (*)    Total Bilirubin 1.3 (*)    All other components within normal limits  CBC WITH DIFFERENTIAL/PLATELET - Abnormal; Notable for the following components:   WBC 16.0 (*)    Neutro Abs 13.3 (*)    Monocytes Absolute 1.4 (*)    Abs  Immature Granulocytes 0.08 (*)    All other components within normal limits  URINALYSIS, W/ REFLEX TO CULTURE (INFECTION SUSPECTED) - Abnormal; Notable for the following components:   APPearance HAZY (*)    Specific Gravity, Urine 1.031 (*)    Hgb urine dipstick LARGE (*)    Ketones, ur 15 (*)    Protein, ur 30 (*)    Leukocytes,Ua LARGE (*)    Bacteria, UA RARE (*)    All other components within normal limits  URINE CULTURE  CULTURE, BLOOD (ROUTINE X 2)  CULTURE, BLOOD (ROUTINE X 2)  LACTIC ACID, PLASMA  GC/CHLAMYDIA PROBE AMP ([place]) NOT AT [facility]    EKG: None  Radiology: No results found.   Procedures   Medications Ordered in the ED  acetaminophen  (TYLENOL ) tablet 650 mg (has no administration in time range)  ciprofloxacin  (CIPRO ) IVPB 400 mg (has no administration in time range)  ondansetron  (ZOFRAN ) injection 4 mg (has no administration in time range)  morphine  (PF) 2 MG/ML injection 2 mg (has no administration in time range)    Clinical Course as of 07/26/23 1739  Sat Jul 26, 2023  1726 Passed PO challenge, feeling better, requesting discharge.  [JB]    Clinical Course User Index [JB] Jimmy Plessinger, Warren SAILOR, PA-C                                 Medical Decision Making Amount and/or Complexity of Data Reviewed Labs: ordered.  Risk OTC drugs. Prescription drug management.   This patient presents to the ED for concern of dysuria, this involves an extensive number of treatment options, and is a complaint that carries with it a high risk of complications and morbidity.  The differential diagnosis includes STD, urinary tract infection, pyelonephritis, kidney stone   Co morbidities that complicate the patient evaluation  Asthma    Lab Tests:  I personally interpreted labs.  The pertinent results include:   Leukocytosis of 16 no anemia CMP shows no electrolyte abnormality.  Normal kidney function.  Normal liver function. UA is positive for leukocytes,  rare bacteria.  Negative for nitrites. Sent for Culture Blood cultures obtained due to leukocytosis and reported fever  Gonorrhea and chlamydia pending Initial lactic is 1.2  Imaging Studies ordered:  Considered but no back pain, flank pain or abdominal pain.   Cardiac Monitoring: / EKG:  The patient was maintained on a cardiac monitor.    Problem List / ED Course / Critical interventions / Medication management  Patient reports with complaint of dysuria, foul-smelling urine and urinary urgency.  He has had  fever at home.  No CVA tenderness or flank pain.  Abdomen is soft and nontender.  Vitals stable throughout stay.  He is tolerating oral intake.  Denies concern for STD or penile discharge.  Will send gonorrhea and chlamydia.  Will send urine for culture.  Labs show no AKI.  No elevation in liver enzymes.  Symptoms seem most consistent with urinary tract infection. Discussed admission and further workup however patient and family declined patient reports he would like to go home and trial oral antibiotic first. Tolerating oral intake, reports feeling better. Given no generalized weakness, abdominal pain, tolerating oral intake trial of antibiotic appropriate.  Cultures will be pending.  Given strict return precautions.  I ordered medication including first dose of antibiotic here Cipro , Toradol , Zofran  Reevaluation of the patient after these medicines showed that the patient improved I have reviewed the patients home medicines and have made adjustments as needed   Plan F/u w/ PCP in 2-3d to ensure resolution of sx.  Patient was given return precautions. Patient stable for discharge at this time.  Patient educated on sx/dx and verbalized understanding of plan. Return to ER w/ new or worsening sx.       Final diagnoses:  Acute cystitis with hematuria    ED Discharge Orders          Ordered    ciprofloxacin  (CIPRO ) 500 MG tablet  Every 12 hours        07/26/23 1734     ondansetron  (ZOFRAN -ODT) 4 MG disintegrating tablet  Every 8 hours PRN        07/26/23 1734               Horald Birky, Warren SAILOR, PA-C 07/26/23 1746    Geraldene Hamilton, MD 07/28/23 2031

## 2023-07-26 NOTE — ED Notes (Signed)
 ED Provider at bedside.

## 2023-07-26 NOTE — ED Notes (Signed)
 Patient given discharge instructions. Questions were answered. Patient verbalized understanding of discharge instructions and care at home.

## 2023-07-26 NOTE — ED Triage Notes (Addendum)
 Patient recently returned from a trip to [place] reporting dysuria and foul smelling urine since Thursday with blood noted in urine today and chills. Temp of 102.7 at home.

## 2023-07-26 NOTE — ED Notes (Signed)
 Blood cultures x2 drawn before starting antibiotics.

## 2023-07-26 NOTE — Discharge Instructions (Addendum)
 Follow up with PCP for recheck, return to ER with new or worsening symptoms.  Take 1000 mg of Tylenol  every 8 hours. You can also take   Take the antibiotic as prescribed.  Take Zofran  as needed for nausea vomiting.

## 2023-07-27 ENCOUNTER — Other Ambulatory Visit: Payer: Self-pay

## 2023-07-27 DIAGNOSIS — G43909 Migraine, unspecified, not intractable, without status migrainosus: Secondary | ICD-10-CM | POA: Insufficient documentation

## 2023-07-27 DIAGNOSIS — F1090 Alcohol use, unspecified, uncomplicated: Secondary | ICD-10-CM | POA: Insufficient documentation

## 2023-07-27 DIAGNOSIS — N413 Prostatocystitis: Principal | ICD-10-CM | POA: Insufficient documentation

## 2023-07-27 DIAGNOSIS — B9620 Unspecified Escherichia coli [E. coli] as the cause of diseases classified elsewhere: Secondary | ICD-10-CM | POA: Insufficient documentation

## 2023-07-27 DIAGNOSIS — Z87891 Personal history of nicotine dependence: Secondary | ICD-10-CM | POA: Insufficient documentation

## 2023-07-27 DIAGNOSIS — Z79899 Other long term (current) drug therapy: Secondary | ICD-10-CM | POA: Insufficient documentation

## 2023-07-27 LAB — CBC
HCT: 39.8 % (ref 39.0–52.0)
Hemoglobin: 13.5 g/dL (ref 13.0–17.0)
MCH: 30.2 pg (ref 26.0–34.0)
MCHC: 33.9 g/dL (ref 30.0–36.0)
MCV: 89 fL (ref 80.0–100.0)
Platelets: 241 10*3/uL (ref 150–400)
RBC: 4.47 MIL/uL (ref 4.22–5.81)
RDW: 13 % (ref 11.5–15.5)
WBC: 8 10*3/uL (ref 4.0–10.5)
nRBC: 0 % (ref 0.0–0.2)

## 2023-07-27 NOTE — ED Triage Notes (Signed)
 Pt POV reporting persistent fatigue, fever, and hematuria, seen yesterday for same, dx cystitis, prescribed abx, reports decreased appetite and constipation as well. Temp 101.5 in triage, last tylenol  2030.

## 2023-07-28 ENCOUNTER — Encounter (HOSPITAL_COMMUNITY): Payer: Self-pay | Admitting: Family Medicine

## 2023-07-28 ENCOUNTER — Observation Stay (HOSPITAL_BASED_OUTPATIENT_CLINIC_OR_DEPARTMENT_OTHER)
Admission: EM | Admit: 2023-07-28 | Discharge: 2023-07-30 | Disposition: A | Discharge status: Home / Self Care | Attending: Internal Medicine | Admitting: Internal Medicine

## 2023-07-28 ENCOUNTER — Emergency Department (HOSPITAL_BASED_OUTPATIENT_CLINIC_OR_DEPARTMENT_OTHER)

## 2023-07-28 DIAGNOSIS — N390 Urinary tract infection, site not specified: Secondary | ICD-10-CM

## 2023-07-28 DIAGNOSIS — A4150 Gram-negative sepsis, unspecified: Secondary | ICD-10-CM | POA: Diagnosis not present

## 2023-07-28 DIAGNOSIS — N410 Acute prostatitis: Principal | ICD-10-CM

## 2023-07-28 LAB — URINE CULTURE: Culture: 60000 — AB

## 2023-07-28 LAB — HIV ANTIBODY (ROUTINE TESTING W REFLEX): HIV Screen 4th Generation wRfx: NONREACTIVE

## 2023-07-28 LAB — CBC
HCT: 36.9 % — ABNORMAL LOW (ref 39.0–52.0)
Hemoglobin: 12.3 g/dL — ABNORMAL LOW (ref 13.0–17.0)
MCH: 29.9 pg (ref 26.0–34.0)
MCHC: 33.3 g/dL (ref 30.0–36.0)
MCV: 89.8 fL (ref 80.0–100.0)
Platelets: 245 10*3/uL (ref 150–400)
RBC: 4.11 MIL/uL — ABNORMAL LOW (ref 4.22–5.81)
RDW: 12.9 % (ref 11.5–15.5)
WBC: 4.9 10*3/uL (ref 4.0–10.5)
nRBC: 0 % (ref 0.0–0.2)

## 2023-07-28 LAB — CREATININE, SERUM
Creatinine, Ser: 0.91 mg/dL (ref 0.61–1.24)
GFR, Estimated: >60 mL/min (ref >60)

## 2023-07-28 LAB — BASIC METABOLIC PANEL WITH GFR
Anion gap: 12 (ref 5–15)
BUN: 10 mg/dL (ref 6–20)
CO2: 23 mmol/L (ref 22–32)
Calcium: 9.2 mg/dL (ref 8.9–10.3)
Chloride: 101 mmol/L (ref 98–111)
Creatinine, Ser: 1.02 mg/dL (ref 0.61–1.24)
GFR, Estimated: >60 mL/min (ref >60)
Glucose, Bld: 119 mg/dL — ABNORMAL HIGH (ref 70–99)
Potassium: 3.9 mmol/L (ref 3.5–5.1)
Sodium: 136 mmol/L (ref 135–145)

## 2023-07-28 LAB — URINALYSIS, ROUTINE W REFLEX MICROSCOPIC
Bilirubin Urine: NEGATIVE
Glucose, UA: NEGATIVE mg/dL
Hgb urine dipstick: NEGATIVE
Ketones, ur: 40 mg/dL — AB
Leukocytes,Ua: NEGATIVE
Nitrite: NEGATIVE
Specific Gravity, Urine: 1.019 (ref 1.005–1.030)
pH: 6.5 (ref 5.0–8.0)

## 2023-07-28 LAB — GC/CHLAMYDIA PROBE AMP (~~LOC~~) NOT AT ARMC
Chlamydia: NEGATIVE
Comment: NEGATIVE
Comment: NORMAL
Neisseria Gonorrhea: NEGATIVE

## 2023-07-28 LAB — LACTIC ACID, PLASMA: Lactic Acid, Venous: 0.8 mmol/L (ref 0.5–1.9)

## 2023-07-28 MED ORDER — ONDANSETRON HCL 4 MG/2ML IJ SOLN
4.0000 mg | Freq: Once | INTRAMUSCULAR | Status: AC
  Administered 2023-07-28: 4 mg via INTRAVENOUS
  Filled 2023-07-28: qty 2

## 2023-07-28 MED ORDER — SODIUM CHLORIDE 0.9 % IV SOLN
1.0000 g | Freq: Once | INTRAVENOUS | Status: AC
  Administered 2023-07-28: 1 g via INTRAVENOUS
  Filled 2023-07-28: qty 10

## 2023-07-28 MED ORDER — IOHEXOL 300 MG/ML  SOLN
100.0000 mL | Freq: Once | INTRAMUSCULAR | Status: AC | PRN
  Administered 2023-07-28: 100 mL via INTRAVENOUS

## 2023-07-28 MED ORDER — KETOROLAC TROMETHAMINE 15 MG/ML IJ SOLN
30.0000 mg | Freq: Once | INTRAMUSCULAR | Status: AC
  Administered 2023-07-28: 30 mg via INTRAVENOUS
  Filled 2023-07-28: qty 2

## 2023-07-28 MED ORDER — SODIUM CHLORIDE 0.9 % IV SOLN: INTRAVENOUS | Status: AC

## 2023-07-28 MED ORDER — BISACODYL 5 MG PO TBEC: 5.0000 mg | DELAYED_RELEASE_TABLET | Freq: Every day | ORAL | Status: DC | PRN

## 2023-07-28 MED ORDER — ALBUTEROL SULFATE (2.5 MG/3ML) 0.083% IN NEBU
2.5000 mg | INHALATION_SOLUTION | RESPIRATORY_TRACT | Status: DC | PRN
Start: 2023-07-28 — End: 2023-07-30

## 2023-07-28 MED ORDER — ONDANSETRON HCL 4 MG PO TABS: 4.0000 mg | ORAL_TABLET | Freq: Four times a day (QID) | ORAL | Status: DC | PRN

## 2023-07-28 MED ORDER — MAGNESIUM HYDROXIDE 400 MG/5ML PO SUSP
30.0000 mL | Freq: Once | ORAL | Status: AC
  Administered 2023-07-28: 30 mL via ORAL
  Filled 2023-07-28: qty 30

## 2023-07-28 MED ORDER — BUTALBITAL-APAP-CAFFEINE 50-325-40 MG PO TABS
1.0000 | ORAL_TABLET | ORAL | Status: DC | PRN
  Administered 2023-07-28 – 2023-07-30 (×4): 1 via ORAL
  Filled 2023-07-28 (×4): qty 1

## 2023-07-28 MED ORDER — ACETAMINOPHEN 500 MG PO TABS
1000.0000 mg | ORAL_TABLET | Freq: Once | ORAL | Status: AC
  Administered 2023-07-28: 1000 mg via ORAL
  Filled 2023-07-28: qty 2

## 2023-07-28 MED ORDER — ENOXAPARIN SODIUM 40 MG/0.4ML IJ SOSY
40.0000 mg | PREFILLED_SYRINGE | INTRAMUSCULAR | Status: DC
  Administered 2023-07-28 – 2023-07-30 (×3): 40 mg via SUBCUTANEOUS
  Filled 2023-07-28 (×2): qty 0.4

## 2023-07-28 MED ORDER — ACETAMINOPHEN 650 MG RE SUPP: 650.0000 mg | Freq: Four times a day (QID) | RECTAL | Status: DC | PRN

## 2023-07-28 MED ORDER — DIPHENHYDRAMINE HCL 50 MG/ML IJ SOLN
50.0000 mg | Freq: Once | INTRAMUSCULAR | Status: AC
  Administered 2023-07-28: 50 mg via INTRAVENOUS
  Filled 2023-07-28: qty 1

## 2023-07-28 MED ORDER — ONDANSETRON HCL 4 MG/2ML IJ SOLN: 4.0000 mg | Freq: Four times a day (QID) | INTRAMUSCULAR | Status: DC | PRN

## 2023-07-28 MED ORDER — IBUPROFEN 200 MG PO TABS
600.0000 mg | ORAL_TABLET | Freq: Four times a day (QID) | ORAL | Status: DC | PRN
  Administered 2023-07-28 – 2023-07-30 (×4): 600 mg via ORAL
  Filled 2023-07-28 (×4): qty 3

## 2023-07-28 MED ORDER — PROCHLORPERAZINE EDISYLATE 10 MG/2ML IJ SOLN
10.0000 mg | Freq: Once | INTRAMUSCULAR | Status: AC
  Administered 2023-07-28: 10 mg via INTRAVENOUS
  Filled 2023-07-28: qty 2

## 2023-07-28 MED ORDER — LACTATED RINGERS IV BOLUS
1000.0000 mL | Freq: Once | INTRAVENOUS | Status: AC
  Administered 2023-07-28: 1000 mL via INTRAVENOUS

## 2023-07-28 MED ORDER — ACETAMINOPHEN 325 MG PO TABS
650.0000 mg | ORAL_TABLET | Freq: Four times a day (QID) | ORAL | Status: DC | PRN
  Administered 2023-07-29: 650 mg via ORAL
  Filled 2023-07-28: qty 2

## 2023-07-28 MED ORDER — SODIUM CHLORIDE 0.9 % IV SOLN
1.0000 g | INTRAVENOUS | Status: DC
  Administered 2023-07-29: 1 g via INTRAVENOUS
  Filled 2023-07-28: qty 10

## 2023-07-28 MED ORDER — OXYCODONE HCL 5 MG PO TABS: 5.0000 mg | ORAL_TABLET | ORAL | Status: DC | PRN

## 2023-07-28 NOTE — Progress Notes (Signed)
   07/28/23 1217  TOC Brief Assessment  Insurance and Status Reviewed  Patient has primary care physician Yes  Home environment has been reviewed Single family home w/ spouse  Prior level of function: Independent  Prior/Current Home Services No current home services  Social Drivers of Health Review SDOH reviewed no interventions necessary  Readmission risk has been reviewed Yes  Transition of care needs no transition of care needs at this time

## 2023-07-28 NOTE — Progress Notes (Signed)
 Plan of Care Note for accepted transfer   Patient: Jeremy Cooley MRN: [id number]   DOA: 07/28/2023  Facility requesting transfer: [facility] Requesting Provider: Jerral Meth, MD Reason for transfer: Sepsis due to prostatitis. Facility course: Jeremy Cooley is a 43 y.o. male presenting for ongoing hematuria. States that he is having headaches over the past few days. Diagnosed with a UTI yesterday and started on cipro . Having some abdominal symptoms as well. Urinary symptoms started on Thursday.  Felt bad then but acutely worsened over the past 24 hours.  When he came to the ER temperature was 101.5 and later 103.1 with a blood pressure 136/99 and otherwise normal vital signs.  Labs revealed unremarkable BMP and CBC.  Lactic acid was 0.8 compared to 1.2 on 6/21.  UA tonight showed negative leukocytes and on 621 UA was positive for UTI.  Urine culture on 6/21 revealed 60K colonies of E. coli.  Abdominal and pelvic CT scan with contrast revealed the following: 1. Mild perivesicular inflammatory stranding as well as infiltration of the periprostatic tissues which may relate to changes of underlying cystoprostatitis. Correlation with urinalysis and urine culture may be helpful.  The patient was given 1 g of IV Rocephin, 30 mg of IV Toradol , 1 mg of IV Zofran  and 10 mg of IV Compazine.  Plan of care: The patient is accepted for admission to Telemetry unit, at [facility]..  The patient will be under the care and responsibility of the EDP until arrival to  [facility].  Author: Madison DELENA Peaches, MD 07/28/2023  Check [email or web address] for on-call coverage.  Nursing staff, Please call [facility] Admits & Consults System-Wide number on Amion as soon as patient's arrival, so appropriate admitting provider can evaluate the pt.

## 2023-07-28 NOTE — ED Notes (Signed)
 Sent urine to the lab with a temporary sunquest label

## 2023-07-28 NOTE — H&P (Signed)
 History and Physical  Jeremy Cooley FMW:[id number] DOB: July 24, 1980 DOA: 07/28/2023  PCP: Mercer Clotilda [name], MD   Chief Complaint: Fever, dysuria  HPI: Jeremy Cooley is a 43 y.o. male with medical history significant for asthma being admitted to the hospital with bacterial Cystoprostatitis.  He has been some dysuria and gross hematuria for the last few days, was diagnosed with a UTI at the [facility] on 6/21 started ciprofloxacin  but presented back to the ER on the evening of 6/22 due to persistent migraine and fever.  Urine culture from 6/21 growing 60K E. coli with susceptibilities pending.  Due to persistent fever, patient has been admitted to the hospitalist service.  Currently his main complaint is migraine headache.  Review of Systems: Please see HPI for pertinent positives and negatives. A complete 10 system review of systems are otherwise negative.  Past Medical History:  Diagnosis Date   Asthma    Past Surgical History:  Procedure Laterality Date   NO PAST SURGERIES     Social History:  reports that he has quit smoking. He has never used smokeless tobacco. He reports current alcohol use of about 3.0 standard drinks of alcohol per week. He reports that he does not use drugs.  No Known Allergies  Family History  Problem Relation Age of Onset   Diabetes Other    Hypertension Other    Allergic rhinitis Father    Asthma Father    Angioedema Neg Hx    Atopy Neg Hx    Eczema Neg Hx    Immunodeficiency Neg Hx    Urticaria Neg Hx      Prior to Admission medications   Medication Sig Start Date End Date Taking? Authorizing Provider  acetaminophen  (TYLENOL ) 500 MG tablet Take 500-1,000 mg by mouth every 6 (six) hours as needed for moderate pain (pain score 4-6).   Yes [provider]  ASHWAGANDHA PO Take 1 tablet by mouth daily.   Yes [provider]  B Complex Vitamins (B COMPLEX 1 PO) Take 1 capsule by mouth daily.   Yes [provider]   budesonide -formoterol  (SYMBICORT ) 160-4.5 MCG/ACT inhaler Inhale 2 puffs into the lungs 2 (two) times daily. Patient taking differently: Inhale 2 puffs into the lungs 2 (two) times daily as needed (shortness of breath). 06/27/16  Yes Padgett, Danita Macintosh, MD  cetirizine (ZYRTEC) 10 MG tablet Take 10 mg by mouth daily.   Yes [provider]  ciprofloxacin  (CIPRO ) 500 MG tablet Take 1 tablet (500 mg total) by mouth every 12 (twelve) hours for 10 days. 07/26/23 08/05/23 Yes Barrett, [name], PA-C  DM-Phenylephrine-Acetaminophen  (VICKS DAYQUIL COLD & FLU PO) Take 2 capsules by mouth every 4 (four) hours as needed (Cold/Cough).   Yes [provider]  fluticasone (FLONASE) 50 MCG/ACT nasal spray Place 1 spray into both nostrils daily.   Yes [provider]  ondansetron  (ZOFRAN -ODT) 4 MG disintegrating tablet Take 1 tablet (4 mg total) by mouth every 8 (eight) hours as needed for nausea or vomiting. 07/26/23  Yes Barrett, [name], PA-C  tetrahydrozoline 0.05 % ophthalmic solution Place 1 drop into both eyes as needed (Dry eye).   Yes [provider]  VITAMIN D-VITAMIN K PO Take 1 tablet by mouth daily.   Yes [provider]    Physical Exam: BP (!) 137/92 (BP Location: Left Arm)   Pulse 79   Temp 99.9 F (37.7 C) (Oral)   Resp 16   Ht 5' 10 (1.778 m)  Wt 90.7 kg   SpO2 98%   BMI 28.70 kg/m  General:  Alert, oriented, calm, in no acute distress, his wife is at the bedside Cardiovascular: RRR, no murmurs or rubs, no peripheral edema  Respiratory: clear to auscultation bilaterally, no wheezes, no crackles  Abdomen: soft, nontender, nondistended, normal bowel tones heard  Skin: dry, no rashes  Musculoskeletal: no joint effusions, normal range of motion  Psychiatric: appropriate affect, normal speech  Neurologic: extraocular muscles intact, clear speech, moving all extremities with intact sensorium         Labs on Admission:  Basic Metabolic  Panel: Recent Labs  Lab 07/26/23 1243 07/27/23 2339  NA 136 136  K 4.0 3.9  CL 100 101  CO2 22 23  GLUCOSE 119* 119*  BUN 11 10  CREATININE 1.00 1.02  CALCIUM 9.7 9.2   Liver Function Tests: Recent Labs  Lab 07/26/23 1243  AST 22  ALT 25  ALKPHOS 70  BILITOT 1.3*  PROT 8.0  ALBUMIN 4.4   No results for input(s): LIPASE, AMYLASE in the last 168 hours. No results for input(s): AMMONIA in the last 168 hours. CBC: Recent Labs  Lab 07/26/23 1243 07/27/23 2339  WBC 16.0* 8.0  NEUTROABS 13.3*  --   HGB 13.7 13.5  HCT 41.0 39.8  MCV 88.6 89.0  PLT 259 241   Cardiac Enzymes: No results for input(s): CKTOTAL, CKMB, CKMBINDEX, TROPONINI in the last 168 hours. BNP (last 3 results) No results for input(s): BNP in the last 8760 hours.  ProBNP (last 3 results) No results for input(s): PROBNP in the last 8760 hours.  CBG: No results for input(s): GLUCAP in the last 168 hours.  Radiological Exams on Admission: CT ABDOMEN PELVIS W CONTRAST Result Date: 07/28/2023 CLINICAL DATA:  Acute nonlocalized abdominal pain EXAM: CT ABDOMEN AND PELVIS WITH CONTRAST TECHNIQUE: Multidetector CT imaging of the abdomen and pelvis was performed using the standard protocol following bolus administration of intravenous contrast. RADIATION DOSE REDUCTION: This exam was performed according to the departmental dose-optimization program which includes automated exposure control, adjustment of the mA and/or kV according to patient size and/or use of iterative reconstruction technique. CONTRAST:  100mL OMNIPAQUE IOHEXOL 300 MG/ML  SOLN COMPARISON:  None Available. FINDINGS: Lower chest: No acute abnormality. Hepatobiliary: Scattered hypodensities within the right hepatic lobe are too small to accurately characterize but likely represent tiny cyst or hemangioma. Focal fatty hepatic infiltration adjacent the falciform ligament. No enhancing intrahepatic mass. No intra or extrahepatic  biliary ductal dilation. Gallbladder unremarkable. Pancreas: Unremarkable Spleen: Unremarkable Adrenals/Urinary Tract: The adrenal glands are unremarkable. The kidneys are normal. The bladder is largely decompressed. There is mild perivesicular inflammatory stranding as well as infiltration of the periprostatic tissues which may relate to changes of underlying cystoprostatitis. Stomach/Bowel: Stomach is within normal limits. Appendix appears normal. No evidence of bowel wall thickening, distention, or inflammatory changes. Vascular/Lymphatic: No significant vascular findings are present. No enlarged abdominal or pelvic lymph nodes. Reproductive: See above. No periprostatic or intraprostatic fluid collection. Prostate gland is mildly enlarged. Other: No abdominal wall hernia or abnormality. No abdominopelvic ascites. Musculoskeletal: No acute or significant osseous findings. IMPRESSION: 1. Mild perivesicular inflammatory stranding as well as infiltration of the periprostatic tissues which may relate to changes of underlying cystoprostatitis. Correlation with urinalysis and urine culture may be helpful. Electronically Signed   By: Dorethia Molt M.D.   On: 07/28/2023 03:43   Assessment/Plan Jahid Weida is a 43 y.o. male with medical history significant for  asthma being admitted to the hospital with bacterial Cystoprostatitis.  Not meeting sepsis criteria, no evidence of abscess, bladder mass or other complication.  Cystoprostatitis-with recent urine culture growing E. Coli -Inpatient admission -Regular diet -Follow-up sensitivities, and repeat urine culture from 6/23 -Continue empiric IV Rocephin -In case of recurrent or chronic symptoms or continued gross hematuria, recommend outpatient urology consultation, this was discussed with the patient and his wife  Persistent fever-continue Tylenol  alternating with ibuprofen  as needed  Migraine headache-Fioricet as needed  Cystoprostatitis  DVT  prophylaxis: Lovenox     Code Status: Full Code  Consults called: None  Admission status: The appropriate patient status for this patient is INPATIENT. Inpatient status is judged to be reasonable and necessary in order to provide the required intensity of service to ensure the patient's safety. The patient's presenting symptoms, physical exam findings, and initial radiographic and laboratory data in the context of their chronic comorbidities is felt to place them at high risk for further clinical deterioration. Furthermore, it is not anticipated that the patient will be medically stable for discharge from the hospital within 2 midnights of admission.    I certify that at the point of admission it is my clinical judgment that the patient will require inpatient hospital care spanning beyond 2 midnights from the point of admission due to high intensity of service, high risk for further deterioration and high frequency of surveillance required  Time spent: 48 minutes  Rida Loudin CHRISTELLA Gail MD [facility] Pager [phone]  If 7PM-7AM, please contact night-coverage [email or web address] Password [facility]  07/28/2023, 11:18 AM

## 2023-07-28 NOTE — ED Notes (Signed)
 ED TO INPATIENT HANDOFF REPORT  ED Nurse Name and Phone #:  Leonor Dollar [phone] S Name/Age/Gender Jeremy Cooley 43 y.o. male Room/Bed: DB011/DB011  Code Status   Code Status: Not on file  Home/SNF/Other Home Patient oriented to: self, place, time, and situation Is this baseline? Yes   Triage Complete: Triage complete  Chief Complaint Sepsis due to gram-negative UTI (HCC) [A41.50, N39.0]  Triage Note Pt POV reporting persistent fatigue, fever, and hematuria, seen yesterday for same, dx cystitis, prescribed abx, reports decreased appetite and constipation as well. Temp 101.5 in triage, last tylenol  2030.   Allergies No Known Allergies  Level of Care/Admitting Diagnosis ED Disposition     ED Disposition  Admit   Condition  --   Comment  Hospital Area: [facility] [[id number]]  Level of Care: Telemetry [5]  Admit to tele based on following criteria: Other see comments  Comments: sepsis  May admit patient to [facility] or [facility] if equivalent level of care is available:: No  Interfacility transfer: Yes  Covid Evaluation: Asymptomatic - no recent exposure (last 10 days) testing not required  Diagnosis: Sepsis due to gram-negative UTI [facility]) [[id number]]  Admitting Physician: LAWENCE MADISON LABOR [[id number]]  Attending Physician: LAWENCE MADISON LABOR [[id number]]  Certification:: I certify this patient will need inpatient services for at least 2 midnights  Expected Medical Readiness: 07/30/2023          B Medical/Surgery History Past Medical History:  Diagnosis Date   Asthma    Past Surgical History:  Procedure Laterality Date   NO PAST SURGERIES       A IV Location/Drains/Wounds Patient Lines/Drains/Airways Status     Active Line/Drains/Airways     Name Placement date Placement time Site Days   Peripheral IV 07/28/23 20 G 0.75 Right Antecubital 07/28/23  0240  Antecubital  less than 1            Intake/Output Last 24 hours No intake or  output data in the 24 hours ending 07/28/23 0453  Labs/Imaging Results for orders placed or performed during the hospital encounter of 07/28/23 (from the past 48 hours)  CBC     Status: None   Collection Time: 07/27/23 11:39 PM  Result Value Ref Range   WBC 8.0 4.0 - 10.5 K/uL   RBC 4.47 4.22 - 5.81 MIL/uL   Hemoglobin 13.5 13.0 - 17.0 g/dL   HCT 60.1 60.9 - 47.9 %   MCV 89.0 80.0 - 100.0 fL   MCH 30.2 26.0 - 34.0 pg   MCHC 33.9 30.0 - 36.0 g/dL   RDW 86.9 88.4 - 84.4 %   Platelets 241 150 - 400 K/uL   nRBC 0.0 0.0 - 0.2 %    Comment: Performed at [facility], [address] [place], [place] [address]  Basic metabolic panel     Status: Abnormal   Collection Time: 07/27/23 11:39 PM  Result Value Ref Range   Sodium 136 135 - 145 mmol/L   Potassium 3.9 3.5 - 5.1 mmol/L   Chloride 101 98 - 111 mmol/L   CO2 23 22 - 32 mmol/L   Glucose, Bld 119 (H) 70 - 99 mg/dL    Comment: Glucose reference range applies only to samples taken after fasting for at least 8 hours.   BUN 10 6 - 20 mg/dL   Creatinine, Ser 8.97 0.61 - 1.24 mg/dL   Calcium 9.2 8.9 - 89.6 mg/dL   GFR, Estimated >39 >39 mL/min  Comment: (NOTE) Calculated using the CKD-EPI Creatinine Equation (2021)    Anion gap 12 5 - 15    Comment: Performed at [facility], [address], [place], [place] [address]  Urinalysis, Routine w reflex microscopic -Urine, Clean Catch     Status: Abnormal   Collection Time: 07/28/23 12:38 AM  Result Value Ref Range   Color, Urine YELLOW YELLOW   APPearance CLEAR CLEAR   Specific Gravity, Urine 1.019 1.005 - 1.030   pH 6.5 5.0 - 8.0   Glucose, UA NEGATIVE NEGATIVE mg/dL   Hgb urine dipstick NEGATIVE NEGATIVE   Bilirubin Urine NEGATIVE NEGATIVE   Ketones, ur 40 (A) NEGATIVE mg/dL   Protein, ur TRACE (A) NEGATIVE mg/dL   Nitrite NEGATIVE NEGATIVE   Leukocytes,Ua NEGATIVE NEGATIVE    Comment: Performed at [facility], [address] [place], [place] [address]  Lactic acid, plasma     Status: None   Collection Time: 07/28/23  4:14 AM  Result Value Ref Range   Lactic Acid, Venous 0.8 0.5 - 1.9 mmol/L    Comment: Performed at [facility], [address] [place], [place] [address]   CT ABDOMEN PELVIS W CONTRAST Result Date: 07/28/2023 CLINICAL DATA:  Acute nonlocalized abdominal pain EXAM: CT ABDOMEN AND PELVIS WITH CONTRAST TECHNIQUE: Multidetector CT imaging of the abdomen and pelvis was performed using the standard protocol following bolus administration of intravenous contrast. RADIATION DOSE REDUCTION: This exam was performed according to the departmental dose-optimization program which includes automated exposure control, adjustment of the mA and/or kV according to patient size and/or use of iterative reconstruction technique. CONTRAST:  100mL OMNIPAQUE IOHEXOL 300 MG/ML  SOLN COMPARISON:  None Available. FINDINGS: Lower chest: No acute abnormality. Hepatobiliary: Scattered hypodensities within the right hepatic lobe are too small to accurately characterize but likely represent tiny cyst or hemangioma. Focal fatty hepatic infiltration adjacent the falciform ligament. No enhancing intrahepatic mass. No intra or extrahepatic biliary ductal dilation. Gallbladder unremarkable. Pancreas: Unremarkable Spleen: Unremarkable Adrenals/Urinary Tract: The adrenal glands are unremarkable. The kidneys are normal. The bladder is largely decompressed. There is mild perivesicular inflammatory stranding as well as infiltration of the periprostatic tissues which may relate to changes of underlying cystoprostatitis. Stomach/Bowel: Stomach is within normal limits. Appendix appears normal. No evidence of bowel wall thickening, distention, or inflammatory changes. Vascular/Lymphatic: No significant vascular findings are present. No enlarged abdominal or pelvic lymph nodes. Reproductive: See above. No periprostatic or  intraprostatic fluid collection. Prostate gland is mildly enlarged. Other: No abdominal wall hernia or abnormality. No abdominopelvic ascites. Musculoskeletal: No acute or significant osseous findings. IMPRESSION: 1. Mild perivesicular inflammatory stranding as well as infiltration of the periprostatic tissues which may relate to changes of underlying cystoprostatitis. Correlation with urinalysis and urine culture may be helpful. Electronically Signed   By: Dorethia Molt M.D.   On: 07/28/2023 03:43    Pending Labs Unresulted Labs (From admission, onward)     Start     Ordered   07/28/23 0403  Lactic acid, plasma  (Lactic Acid)  Now then every 2 hours,   R (with STAT occurrences)      07/28/23 0402   07/28/23 0402  Urine Culture  Add-on,   URGENT       Question:  Indication  Answer:  Dysuria   07/28/23 0402   07/28/23 0402  Blood culture (routine x 2)  BLOOD CULTURE X 2,   STAT      07/28/23 0402  Vitals/Pain Today's Vitals   07/28/23 0244 07/28/23 0247 07/28/23 0344 07/28/23 0345  BP:    120/82  Pulse:    87  Resp:    16  Temp:    (!) 102.3 F (39.1 C)  TempSrc:    Oral  SpO2:    94%  Weight:      Height:      PainSc: 7  7  3       Isolation Precautions No active isolations  Medications Medications  ondansetron  (ZOFRAN ) injection 4 mg (4 mg Intravenous Given 07/28/23 0249)  lactated ringers bolus 1,000 mL (0 mLs Intravenous Stopped 07/28/23 0310)  ketorolac  (TORADOL ) 15 MG/ML injection 30 mg (30 mg Intravenous Given 07/28/23 0247)  cefTRIAXone (ROCEPHIN) 1 g in sodium chloride 0.9 % 100 mL IVPB (0 g Intravenous Stopped 07/28/23 0413)  prochlorperazine (COMPAZINE) injection 10 mg (10 mg Intravenous Given 07/28/23 0253)  diphenhydrAMINE (BENADRYL) injection 50 mg (50 mg Intravenous Given 07/28/23 0250)  acetaminophen  (TYLENOL ) tablet 1,000 mg (1,000 mg Oral Given 07/28/23 0244)  iohexol (OMNIPAQUE) 300 MG/ML solution 100 mL (100 mLs Intravenous Contrast Given 07/28/23  0314)    Mobility walks     Focused Assessments Renal Assessment Handoff:  Hemodialysis Schedule: Last Hemodialysis date and time:    Restricted appendage:     R Recommendations: See Admitting Provider Note  Report given to:   Additional Notes:

## 2023-07-28 NOTE — ED Provider Notes (Signed)
 [place] EMERGENCY DEPARTMENT AT [facility] Provider Note   CSN: [id number] Arrival date & time: 07/27/23  2320     History Chief Complaint  Patient presents with   Fatigue   Hematuria    HPI Jeremy Cooley is a 43 y.o. male presenting for ongoing hematuria. States that he is having headaches over the past few days. Diagnosed with a UTI yesterday and started on cipro . Having some abdominal symptoms as well. Urinary symptoms started on Thursday.  Felt bad then but acutely worsened over the past 24 hours.  Patient's recorded medical, surgical, social, medication list and allergies were reviewed in the Snapshot window as part of the initial history.   Review of Systems   Review of Systems  Constitutional:  Negative for chills and fever.  HENT:  Negative for ear pain and sore throat.   Eyes:  Negative for pain and visual disturbance.  Respiratory:  Negative for cough and shortness of breath.   Cardiovascular:  Negative for chest pain and palpitations.  Gastrointestinal:  Negative for abdominal pain and vomiting.  Genitourinary:  Negative for dysuria and hematuria.  Musculoskeletal:  Negative for arthralgias and back pain.  Skin:  Negative for color change and rash.  Neurological:  Negative for seizures and syncope.  All other systems reviewed and are negative.   Physical Exam Updated Vital Signs BP 120/82 (BP Location: Left Arm)   Pulse 87   Temp (!) 102.3 F (39.1 C) (Oral)   Resp 16   Ht 5' 10 (1.778 m)   Wt 90.7 kg   SpO2 94%   BMI 28.70 kg/m  Physical Exam Vitals and nursing note reviewed.  Constitutional:      General: He is not in acute distress.    Appearance: He is well-developed.  HENT:     Head: Normocephalic and atraumatic.   Eyes:     Conjunctiva/sclera: Conjunctivae normal.    Cardiovascular:     Rate and Rhythm: Normal rate and regular rhythm.     Heart sounds: No murmur heard. Pulmonary:     Effort: Pulmonary effort is normal. No  respiratory distress.     Breath sounds: Normal breath sounds.  Abdominal:     Palpations: Abdomen is soft.     Tenderness: There is no abdominal tenderness.   Musculoskeletal:        General: No swelling.     Cervical back: Neck supple.   Skin:    General: Skin is warm and dry.     Capillary Refill: Capillary refill takes less than 2 seconds.   Neurological:     Mental Status: He is alert.   Psychiatric:        Mood and Affect: Mood normal.      ED Course/ Medical Decision Making/ A&P    Procedures Procedures   Medications Ordered in ED Medications  ondansetron  (ZOFRAN ) injection 4 mg (4 mg Intravenous Given 07/28/23 0249)  lactated ringers bolus 1,000 mL (0 mLs Intravenous Stopped 07/28/23 0310)  ketorolac  (TORADOL ) 15 MG/ML injection 30 mg (30 mg Intravenous Given 07/28/23 0247)  cefTRIAXone (ROCEPHIN) 1 g in sodium chloride 0.9 % 100 mL IVPB (0 g Intravenous Stopped 07/28/23 0413)  prochlorperazine (COMPAZINE) injection 10 mg (10 mg Intravenous Given 07/28/23 0253)  diphenhydrAMINE (BENADRYL) injection 50 mg (50 mg Intravenous Given 07/28/23 0250)  acetaminophen  (TYLENOL ) tablet 1,000 mg (1,000 mg Oral Given 07/28/23 0244)  iohexol (OMNIPAQUE) 300 MG/ML solution 100 mL (100 mLs Intravenous Contrast Given 07/28/23 0314)  Medical Decision Making:   Jeremy Cooley is a 43 y.o. male who presented to the ED today with abdominal pain, detailed above.    Patient placed on continuous vitals and telemetry monitoring while in ED which was reviewed periodically.  Complete initial physical exam performed, notably the patient  was HDS in NAD.     Reviewed and confirmed nursing documentation for past medical history, family history, social history.    Initial Assessment:   With the patient's presentation of abdominal pain, most likely diagnosis is nonspecific etiology. Other diagnoses were considered including (but not limited to) gastroenteritis, colitis, small bowel obstruction,  appendicitis, cholecystitis, pancreatitis, nephrolithiasis, UTI, pyleonephritis. These are considered less likely due to history of present illness and physical exam findings.   This is most consistent with an acute life/limb threatening illness complicated by underlying chronic conditions.   Initial Plan:  CBC/CMP to evaluate for underlying infectious/metabolic etiology for patient's abdominal pain  Lipase to evaluate for pancreatitis  EKG to evaluate for cardiac source of pain  CTAB/Pelvis with contrast to evaluate for structural/surgical etiology of patients' severe abdominal pain.  Urinalysis and repeat physical assessment to evaluate for UTI/Pyelonpehritis  Empiric management of symptoms with escalating pain control and antiemetics as needed.   Initial Study Results:   Laboratory  All laboratory results reviewed without evidence of clinically relevant pathology.    EKG EKG was reviewed independently. Rate, rhythm, axis, intervals all examined and without medically relevant abnormality. ST segments without concerns for elevations.    Radiology All images reviewed independently. Agree with radiology report at this time.   CT ABDOMEN PELVIS W CONTRAST Result Date: 07/28/2023 CLINICAL DATA:  Acute nonlocalized abdominal pain EXAM: CT ABDOMEN AND PELVIS WITH CONTRAST TECHNIQUE: Multidetector CT imaging of the abdomen and pelvis was performed using the standard protocol following bolus administration of intravenous contrast. RADIATION DOSE REDUCTION: This exam was performed according to the departmental dose-optimization program which includes automated exposure control, adjustment of the mA and/or kV according to patient size and/or use of iterative reconstruction technique. CONTRAST:  100mL OMNIPAQUE IOHEXOL 300 MG/ML  SOLN COMPARISON:  None Available. FINDINGS: Lower chest: No acute abnormality. Hepatobiliary: Scattered hypodensities within the right hepatic lobe are too small to accurately  characterize but likely represent tiny cyst or hemangioma. Focal fatty hepatic infiltration adjacent the falciform ligament. No enhancing intrahepatic mass. No intra or extrahepatic biliary ductal dilation. Gallbladder unremarkable. Pancreas: Unremarkable Spleen: Unremarkable Adrenals/Urinary Tract: The adrenal glands are unremarkable. The kidneys are normal. The bladder is largely decompressed. There is mild perivesicular inflammatory stranding as well as infiltration of the periprostatic tissues which may relate to changes of underlying cystoprostatitis. Stomach/Bowel: Stomach is within normal limits. Appendix appears normal. No evidence of bowel wall thickening, distention, or inflammatory changes. Vascular/Lymphatic: No significant vascular findings are present. No enlarged abdominal or pelvic lymph nodes. Reproductive: See above. No periprostatic or intraprostatic fluid collection. Prostate gland is mildly enlarged. Other: No abdominal wall hernia or abnormality. No abdominopelvic ascites. Musculoskeletal: No acute or significant osseous findings. IMPRESSION: 1. Mild perivesicular inflammatory stranding as well as infiltration of the periprostatic tissues which may relate to changes of underlying cystoprostatitis. Correlation with urinalysis and urine culture may be helpful. Electronically Signed   By: Dorethia Molt M.D.   On: 07/28/2023 03:43    Final Reassessment and Plan:   Labwork gradually improving. CT showing prostatitis. Patient remained febrile despite multiple interventions. Given failure of OP ABX. Will escalate to IV ABX and admit for ongoing observation.  Disposition:   Based on the above findings, I believe this patient is stable for admission.    Patient/family educated about specific findings on our evaluation and explained exact reasons for admission.  Patient/family educated about clinical situation and time was allowed to answer questions.   Admission team communicated with and  agreed with need for admission. Patient admitted. Patient ready to move at this time.     Emergency Department Medication Summary:   Medications  ondansetron  (ZOFRAN ) injection 4 mg (4 mg Intravenous Given 07/28/23 0249)  lactated ringers bolus 1,000 mL (0 mLs Intravenous Stopped 07/28/23 0310)  ketorolac  (TORADOL ) 15 MG/ML injection 30 mg (30 mg Intravenous Given 07/28/23 0247)  cefTRIAXone (ROCEPHIN) 1 g in sodium chloride 0.9 % 100 mL IVPB (0 g Intravenous Stopped 07/28/23 0413)  prochlorperazine (COMPAZINE) injection 10 mg (10 mg Intravenous Given 07/28/23 0253)  diphenhydrAMINE (BENADRYL) injection 50 mg (50 mg Intravenous Given 07/28/23 0250)  acetaminophen  (TYLENOL ) tablet 1,000 mg (1,000 mg Oral Given 07/28/23 0244)  iohexol (OMNIPAQUE) 300 MG/ML solution 100 mL (100 mLs Intravenous Contrast Given 07/28/23 0314)         Clinical Impression:  1. Acute prostatitis      Admit   Final Clinical Impression(s) / ED Diagnoses Final diagnoses:  Acute prostatitis    Rx / DC Orders ED Discharge Orders     None         Jerral Meth, MD 07/28/23 [phone]

## 2023-07-28 NOTE — ED Notes (Signed)
 Called [facility] to transport patient to [facility] rm# [phone]

## 2023-07-29 DIAGNOSIS — N390 Urinary tract infection, site not specified: Secondary | ICD-10-CM | POA: Diagnosis not present

## 2023-07-29 DIAGNOSIS — A4150 Gram-negative sepsis, unspecified: Secondary | ICD-10-CM | POA: Diagnosis not present

## 2023-07-29 LAB — URINE CULTURE: Culture: NO GROWTH

## 2023-07-29 LAB — BASIC METABOLIC PANEL WITH GFR
Anion gap: 10 (ref 5–15)
BUN: 12 mg/dL (ref 6–20)
CO2: 25 mmol/L (ref 22–32)
Calcium: 8.6 mg/dL — ABNORMAL LOW (ref 8.9–10.3)
Chloride: 105 mmol/L (ref 98–111)
Creatinine, Ser: 0.76 mg/dL (ref 0.61–1.24)
GFR, Estimated: >60 mL/min (ref >60)
Glucose, Bld: 107 mg/dL — ABNORMAL HIGH (ref 70–99)
Potassium: 3.8 mmol/L (ref 3.5–5.1)
Sodium: 140 mmol/L (ref 135–145)

## 2023-07-29 LAB — CBC
HCT: 39.5 % (ref 39.0–52.0)
Hemoglobin: 12.9 g/dL — ABNORMAL LOW (ref 13.0–17.0)
MCH: 29.7 pg (ref 26.0–34.0)
MCHC: 32.7 g/dL (ref 30.0–36.0)
MCV: 90.8 fL (ref 80.0–100.0)
Platelets: 267 10*3/uL (ref 150–400)
RBC: 4.35 MIL/uL (ref 4.22–5.81)
RDW: 12.9 % (ref 11.5–15.5)
WBC: 3.8 10*3/uL — ABNORMAL LOW (ref 4.0–10.5)
nRBC: 0 % (ref 0.0–0.2)

## 2023-07-29 MED ORDER — CEFAZOLIN SODIUM-DEXTROSE 1-4 GM/50ML-% IV SOLN
1.0000 g | Freq: Three times a day (TID) | INTRAVENOUS | Status: DC
  Administered 2023-07-30 (×2): 1 g via INTRAVENOUS
  Filled 2023-07-29 (×2): qty 50

## 2023-07-29 NOTE — Plan of Care (Signed)

## 2023-07-29 NOTE — Progress Notes (Signed)
 PROGRESS NOTE  Jeremy Cooley  DOB: 11/29/1980  PCP: Mercer Clotilda [name], MD FMW:[id number]  DOA: 07/28/2023  LOS: 1 day  Hospital Day: 2  Brief narrative: Jeremy Cooley is a 43 y.o. male with PMH significant for asthma. 6/21, patient was seen at [facility] ED for dysuria, foul-smelling urine, blood in urine sutured with fever up to 102.7 for 2 days.  UA was positive for leukocytes, negative for nitrates.  Blood culture and urine culture was sent.  Admission was advised but patient wanted to try oral antibiotic and hence she was discharged on ciprofloxacin . Next day 6/22, patient returned back to ED with persistent symptoms and fever.  In the ED, patient had a fever of 102.1, heart rate 86, blood pressure 142/79. Labs with CBC unremarkable, BMP unremarkable Repeat urinalysis showed clear yellow urine negative for leukocytes, nitrite. CT abdomen pelvis was obtained which showed mild perivesicular inflammatory stranding as well as infiltration of the periprostatic tissues which may relate to changes of underlying cystoprostatitis.  Urine culture sent on 6/21 showed 60,000 CFU per mL of E. coli Patient was started on IV antibiotics Admitted to [facility]   Subjective: Patient was seen and examined this morning. Young African-American male.  Lying on bed.  Not in distress. Urine is less burning now Chart reviewed Fever seems to be subsiding Tmax 100.5 in the last 24 hours Labs from this morning with WC count 3.8, otherwise unremarkable  Assessment and plan: Acute cystoprostatitis Symptomatic UTI with E. Coli Urine culture sent on 6/21 showed 60,000 CFU per mL of E. Coli Which seems repeat blood culture and urine culture have been sent this presentation. Currently on IV Rocephin.   Reports urine is less burning now.  Blood is clearing up as well.  Fever spikes less often. Discussed with pharmacy.  Will switch antibiotic to IV Ancef.  To continue Tylenol  as needed. Recent Labs  Lab  07/26/23 1243 07/27/23 2339 07/28/23 0414 07/28/23 1452 07/29/23 0528  WBC 16.0* 8.0  --  4.9 3.8*  LATICACIDVEN 1.2  --  0.8  --   --    Migraine headache-Fioricet as needed     Mobility: Encourage ambulation  Goals of care   Code Status: Full Code     DVT prophylaxis:  enoxaparin (LOVENOX) injection 40 mg Start: 07/28/23 1215   Antimicrobials: IV Ancef Fluid: None Consultants: None Family Communication: Family at bedside  Status: Inpatient Level of care:  Telemetry   Patient is from: Home Needs to continue in-hospital care: Needs IV antibiotics Anticipated d/c to: Hopefully home tomorrow if fever subsides     Diet:  Diet Order             Diet regular Room service appropriate? Yes; Fluid consistency: Thin  Diet effective now                   Scheduled Meds:  enoxaparin (LOVENOX) injection  40 mg Subcutaneous Q24H    PRN meds: acetaminophen  **OR** acetaminophen , albuterol , bisacodyl, butalbital-acetaminophen -caffeine, ibuprofen , ondansetron  **OR** ondansetron  (ZOFRAN ) IV, oxyCODONE   Infusions:   [START ON 07/30/2023]  ceFAZolin (ANCEF) IV      Antimicrobials: Anti-infectives (From admission, onward)    Start     Dose/Rate Route Frequency Ordered Stop   07/30/23 0500  ceFAZolin (ANCEF) IVPB 1 g/50 mL premix        1 g 100 mL/hr over 30 Minutes Intravenous Every 8 hours 07/29/23 1004     07/29/23 0500  cefTRIAXone (ROCEPHIN) 1 g in  sodium chloride 0.9 % 100 mL IVPB  Status:  Discontinued        1 g 200 mL/hr over 30 Minutes Intravenous Every 24 hours 07/28/23 0831 07/29/23 1004   07/28/23 0215  cefTRIAXone (ROCEPHIN) 1 g in sodium chloride 0.9 % 100 mL IVPB        1 g 200 mL/hr over 30 Minutes Intravenous  Once 07/28/23 0201 07/28/23 0413       Objective: Vitals:   07/29/23 0521 07/29/23 0927  BP: (!) 148/100 (!) 142/103  Pulse: 95 73  Resp: 18   Temp: 100.3 F (37.9 C) 100.1 F (37.8 C)  SpO2: 98% 96%    Intake/Output Summary  (Last 24 hours) at 07/29/2023 1103 Last data filed at 07/29/2023 0450 Gross per 24 hour  Intake 1046.8 ml  Output --  Net 1046.8 ml   Filed Weights   07/27/23 2330  Weight: 90.7 kg   Weight change:  Body mass index is 28.7 kg/m.   Physical Exam: General exam: Pleasant, young African-American male.  Not in distress Skin: No rashes, lesions or ulcers. HEENT: Atraumatic, normocephalic, no obvious bleeding Lungs: Clear to auscultation bilaterally,  CVS: S1, S2, no murmur,   GI/Abd: Soft, nontender, nondistended, bowel sound present,   CNS: Alert, awake, oriented x 3 Psychiatry: Mood appropriate,  Extremities: No pedal edema, no calf tenderness,   Data Review: I have personally reviewed the laboratory data and studies available.  F/u labs ordered Unresulted Labs (From admission, onward)     Start     Ordered   08/04/23 0500  Creatinine, serum  (enoxaparin (LOVENOX)    CrCl >/= 30 ml/min)  Weekly,   R     Comments: while on enoxaparin therapy    07/28/23 1120            Signed, Chapman Rota, MD [facility] 07/29/2023

## 2023-07-30 DIAGNOSIS — A4150 Gram-negative sepsis, unspecified: Secondary | ICD-10-CM | POA: Diagnosis not present

## 2023-07-30 DIAGNOSIS — N390 Urinary tract infection, site not specified: Secondary | ICD-10-CM | POA: Diagnosis not present

## 2023-07-30 MED ORDER — CEFPODOXIME PROXETIL 100 MG PO TABS: 200.0000 mg | ORAL_TABLET | Freq: Two times a day (BID) | ORAL | 0 refills | Status: AC

## 2023-07-30 MED ORDER — FLORANEX PO PACK: 1.0000 g | PACK | Freq: Three times a day (TID) | ORAL | 0 refills | Status: AC

## 2023-07-30 NOTE — Plan of Care (Signed)
   Problem: Education: Goal: Knowledge of General Education information will improve Description Including pain rating scale, medication(s)/side effects and non-pharmacologic comfort measures Outcome: Progressing   Problem: Health Behavior/Discharge Planning: Goal: Ability to manage health-related needs will improve Outcome: Progressing

## 2023-07-30 NOTE — Plan of Care (Signed)
  Problem: Education: Goal: Knowledge of General Education information will improve Description: Including pain rating scale, medication(s)/side effects and non-pharmacologic comfort measures 07/30/2023 1257 by Jene Hurl, RN Outcome: Adequate for Discharge 07/30/2023 1147 by Jene Hurl, RN Outcome: Progressing   Problem: Health Behavior/Discharge Planning: Goal: Ability to manage health-related needs will improve 07/30/2023 1257 by Jene Hurl, RN Outcome: Adequate for Discharge 07/30/2023 1147 by Jene Hurl, RN Outcome: Progressing   Problem: Clinical Measurements: Goal: Ability to maintain clinical measurements within normal limits will improve 07/30/2023 1257 by Jene Hurl, RN Outcome: Adequate for Discharge 07/30/2023 1147 by Jene Hurl, RN Outcome: Progressing Goal: Will remain free from infection 07/30/2023 1257 by Jene Hurl, RN Outcome: Adequate for Discharge 07/30/2023 1147 by Jene Hurl, RN Outcome: Progressing Goal: Diagnostic test results will improve 07/30/2023 1257 by Jene Hurl, RN Outcome: Adequate for Discharge 07/30/2023 1147 by Jene Hurl, RN Outcome: Progressing Goal: Respiratory complications will improve 07/30/2023 1257 by Jene Hurl, RN Outcome: Adequate for Discharge 07/30/2023 1147 by Jene Hurl, RN Outcome: Progressing Goal: Cardiovascular complication will be avoided 07/30/2023 1257 by Jene Hurl, RN Outcome: Adequate for Discharge 07/30/2023 1147 by Jene Hurl, RN Outcome: Progressing   Problem: Activity: Goal: Risk for activity intolerance will decrease 07/30/2023 1257 by Jene Hurl, RN Outcome: Adequate for Discharge 07/30/2023 1147 by Jene Hurl, RN Outcome: Progressing   Problem: Nutrition: Goal: Adequate nutrition will be maintained 07/30/2023 1257 by Jene Hurl, RN Outcome: Adequate for Discharge 07/30/2023 1147 by Jene Hurl, RN Outcome: Progressing   Problem: Coping: Goal: Level of anxiety will  decrease 07/30/2023 1257 by Jene Hurl, RN Outcome: Adequate for Discharge 07/30/2023 1147 by Jene Hurl, RN Outcome: Progressing   Problem: Elimination: Goal: Will not experience complications related to bowel motility 07/30/2023 1257 by Jene Hurl, RN Outcome: Adequate for Discharge 07/30/2023 1147 by Jene Hurl, RN Outcome: Progressing Goal: Will not experience complications related to urinary retention 07/30/2023 1257 by Jene Hurl, RN Outcome: Adequate for Discharge 07/30/2023 1147 by Jene Hurl, RN Outcome: Progressing   Problem: Pain Managment: Goal: General experience of comfort will improve and/or be controlled 07/30/2023 1257 by Jene Hurl, RN Outcome: Adequate for Discharge 07/30/2023 1147 by Jene Hurl, RN Outcome: Progressing   Problem: Safety: Goal: Ability to remain free from injury will improve 07/30/2023 1257 by Jene Hurl, RN Outcome: Adequate for Discharge 07/30/2023 1147 by Jene Hurl, RN Outcome: Progressing   Problem: Skin Integrity: Goal: Risk for impaired skin integrity will decrease 07/30/2023 1257 by Jene Hurl, RN Outcome: Adequate for Discharge 07/30/2023 1147 by Jene Hurl, RN Outcome: Progressing

## 2023-07-30 NOTE — Plan of Care (Signed)
  Problem: Activity: Goal: Risk for activity intolerance will decrease Outcome: Progressing   Problem: Pain Managment: Goal: General experience of comfort will improve and/or be controlled Outcome: Progressing

## 2023-07-30 NOTE — Discharge Summary (Signed)
 Physician Discharge Summary  Jeremy Cooley [facility] Cooley FMW:[id number] DOB: 14-Jan-1981 DOA: 07/28/2023  PCP: No primary care provider on file.  Admit date: 07/28/2023 Discharge date: 07/30/2023  Admitted From: Home Discharge disposition: Home  Recommendations at discharge:  Complete the course of antibiotics with 2 more weeks of Cefpodoxime BID with probiotics.     Brief narrative: Jeremy Cooley is a 43 y.o. male with PMH significant for asthma. 6/21, [facility] dysuria, foul-smelling urine, blood in urine sutured with fever up to 102.7 for 2 days.  UA was positive for leukocytes, negative for nitrates.  Blood culture and urine culture was sent.  Admission was advised but patient wanted to try oral antibiotic and hence she was discharged on ciprofloxacin . Next day 6/22, patient returned back to ED with persistent symptoms and fever.  In the ED, patient had a fever of 102.1, heart rate 86, blood pressure 142/79. Labs with CBC unremarkable, BMP unremarkable Repeat urinalysis showed clear yellow urine negative for leukocytes, nitrite. CT abdomen pelvis was obtained which showed mild perivesicular inflammatory stranding as well as infiltration of the periprostatic tissues which may relate to changes of underlying cystoprostatitis.  Urine culture sent on 6/21 showed 60,000 CFU per mL of E. coli Patient was started on IV antibiotics Admitted to [facility]   Subjective: Patient was seen and examined this morning. Young African-American male.  Lying on bed.  Not in distress. Burning urination has resolved.  No fever in the last 24 hours. This started to go home today.  Hospital course: Acute cystoprostatitis Symptomatic UTI with E. Coli Urine culture sent on 6/21 showed 60,000 CFU per mL of E. Coli Repeat blood culture and urine culture sent on 6/22 did not show any growth. Currently improving on IV Ancef.   Burning urination has resolved.  No fever in the  last 24 hours.  Hematuria has resolved. Fluoroquinolones would be the first choice of antibiotic but patient already failed. Ciprofloxacin . Per Up-to-date and 'OpenEvidence' guideline, cefadroxil does not have good tissue penetration to prostate gland. Since he responded well to IV Ancef, decision was made to discharge him on oral Vantin 200 mg twice daily for 2 weeks.  Recent Labs  Lab 07/26/23 1243 07/27/23 2339 07/28/23 0414 07/28/23 1452 07/29/23 0528  WBC 16.0* 8.0  --  4.9 3.8*  LATICACIDVEN 1.2  --  0.8  --   --    Migraine headache-Fioricet as needed     Mobility: Encourage ambulation  Goals of care   Code Status: Full Code   Diet:  Diet Order             Diet general           Diet regular Room service appropriate? Yes; Fluid consistency: Thin  Diet effective now                   Nutritional status:  Body mass index is 28.7 kg/m.       Wounds:  -    Discharge Exam:   Vitals:   07/29/23 1155 07/29/23 1957 07/30/23 0410 07/30/23 1228  BP: (!) 140/101 (!) 137/96 111/64 (!) 133/95  Pulse: 67 66 70 64  Resp:  16 16 16   Temp: 98.7 F (37.1 C) 98.1 F (36.7 C) 98.6 F (37 C) 98.3 F (36.8 C)  TempSrc: Oral     SpO2: 97% 98% 99% 97%  Weight:      Height:  Body mass index is 28.7 kg/m.   General exam: Pleasant, young African-American male.  Not in distress Skin: No rashes, lesions or ulcers. HEENT: Atraumatic, normocephalic, no obvious bleeding Lungs: Clear to auscultation bilaterally,  CVS: S1, S2, no murmur,   GI/Abd: Soft, nontender, nondistended, bowel sound present,   CNS: Alert, awake, oriented x 3 Psychiatry: Mood appropriate,  Extremities: No pedal edema, no calf tenderness,   Follow ups:    Follow-up Information     Connect with your PCP/Specialist as discussed. Schedule an appointment as soon as possible for a visit .   Contact information: [email or web address] Call our physician referral line at  [phone].                Discharge Instructions:   Discharge Instructions     Call MD for:  difficulty breathing, headache or visual disturbances   Complete by: As directed    Call MD for:  extreme fatigue   Complete by: As directed    Call MD for:  hives   Complete by: As directed    Call MD for:  persistant dizziness or light-headedness   Complete by: As directed    Call MD for:  persistant nausea and vomiting   Complete by: As directed    Call MD for:  severe uncontrolled pain   Complete by: As directed    Call MD for:  temperature >100.4   Complete by: As directed    Diet general   Complete by: As directed    Discharge instructions   Complete by: As directed    Recommendations at discharge:   Complete the course of antibiotics with 2 more weeks of Cefpodoxime BID with probiotics.   General discharge instructions: Follow with Primary MD No primary care provider on file. in 7 days  Please request your PCP  to go over your hospital tests, procedures, radiology results at the follow up. Please get your medicines reviewed and adjusted.  Your PCP may decide to repeat certain labs or tests as needed. Do not drive, operate heavy machinery, perform activities at heights, swimming or participation in water activities or provide baby sitting services if your were admitted for syncope or siezures until you have seen by Primary MD or a Neurologist and advised to do so again. [place]  Controlled Substance Reporting System database was reviewed. Do not drive, operate heavy machinery, perform activities at heights, swim, participate in water activities or provide baby-sitting services while on medications for pain, sleep and mood until your outpatient physician has reevaluated you and advised to do so again.  You are strongly recommended to comply with the dose, frequency and duration of prescribed medications. Activity: As tolerated with Full fall precautions use  walker/cane & assistance as needed Avoid using any recreational substances like cigarette, tobacco, alcohol, or non-prescribed drug. If you experience worsening of your admission symptoms, develop shortness of breath, life threatening emergency, suicidal or homicidal thoughts you must seek medical attention immediately by calling 911 or calling your MD immediately  if symptoms less severe. You must read complete instructions/literature along with all the possible adverse reactions/side effects for all the medicines you take and that have been prescribed to you. Take any new medicine only after you have completely understood and accepted all the possible adverse reactions/side effects.  Wear Seat belts while driving. You were cared for by a hospitalist during your hospital stay. If you have any questions about your discharge medications or the care you received while you were  in the hospital after you are discharged, you can call the unit and ask to speak with the hospitalist or the covering physician. Once you are discharged, your primary care physician will handle any further medical issues. Please note that NO REFILLS for any discharge medications will be authorized once you are discharged, as it is imperative that you return to your primary care physician (or establish a relationship with a primary care physician if you do not have one).   Increase activity slowly   Complete by: As directed        Discharge Medications:   Allergies as of 07/30/2023   No Known Allergies      Medication List     STOP taking these medications    ciprofloxacin  500 MG tablet Commonly known as: CIPRO        TAKE these medications    acetaminophen  500 MG tablet Commonly known as: TYLENOL  Take 500-1,000 mg by mouth every 6 (six) hours as needed for moderate pain (pain score 4-6).   ASHWAGANDHA PO Take 1 tablet by mouth daily.   B COMPLEX 1 PO Take 1 capsule by mouth daily.   budesonide -formoterol   160-4.5 MCG/ACT inhaler Commonly known as: Symbicort  Inhale 2 puffs into the lungs 2 (two) times daily. What changed:  when to take this reasons to take this   cefpodoxime 100 MG tablet Commonly known as: VANTIN Take 2 tablets (200 mg total) by mouth 2 (two) times daily for 14 days.   cetirizine 10 MG tablet Commonly known as: ZYRTEC Take 10 mg by mouth daily.   fluticasone 50 MCG/ACT nasal spray Commonly known as: FLONASE Place 1 spray into both nostrils daily.   lactobacillus Pack Take 1 packet (1 g total) by mouth 3 (three) times daily with meals for 14 days.   ondansetron  4 MG disintegrating tablet Commonly known as: ZOFRAN -ODT Take 1 tablet (4 mg total) by mouth every 8 (eight) hours as needed for nausea or vomiting.   tetrahydrozoline 0.05 % ophthalmic solution Place 1 drop into both eyes as needed (Dry eye).   VICKS DAYQUIL COLD & FLU PO Take 2 capsules by mouth every 4 (four) hours as needed (Cold/Cough).   VITAMIN D-VITAMIN K PO Take 1 tablet by mouth daily.         The results of significant diagnostics from this hospitalization (including imaging, microbiology, ancillary and laboratory) are listed below for reference.    Procedures and Diagnostic Studies:   CT ABDOMEN PELVIS W CONTRAST Result Date: 07/28/2023 CLINICAL DATA:  Acute nonlocalized abdominal pain EXAM: CT ABDOMEN AND PELVIS WITH CONTRAST TECHNIQUE: Multidetector CT imaging of the abdomen and pelvis was performed using the standard protocol following bolus administration of intravenous contrast. RADIATION DOSE REDUCTION: This exam was performed according to the departmental dose-optimization program which includes automated exposure control, adjustment of the mA and/or kV according to patient size and/or use of iterative reconstruction technique. CONTRAST:  100mL OMNIPAQUE IOHEXOL 300 MG/ML  SOLN COMPARISON:  None Available. FINDINGS: Lower chest: No acute abnormality. Hepatobiliary: Scattered  hypodensities within the right hepatic lobe are too small to accurately characterize but likely represent tiny cyst or hemangioma. Focal fatty hepatic infiltration adjacent the falciform ligament. No enhancing intrahepatic mass. No intra or extrahepatic biliary ductal dilation. Gallbladder unremarkable. Pancreas: Unremarkable Spleen: Unremarkable Adrenals/Urinary Tract: The adrenal glands are unremarkable. The kidneys are normal. The bladder is largely decompressed. There is mild perivesicular inflammatory stranding as well as infiltration of the periprostatic tissues which may relate to changes  of underlying cystoprostatitis. Stomach/Bowel: Stomach is within normal limits. Appendix appears normal. No evidence of bowel wall thickening, distention, or inflammatory changes. Vascular/Lymphatic: No significant vascular findings are present. No enlarged abdominal or pelvic lymph nodes. Reproductive: See above. No periprostatic or intraprostatic fluid collection. Prostate gland is mildly enlarged. Other: No abdominal wall hernia or abnormality. No abdominopelvic ascites. Musculoskeletal: No acute or significant osseous findings. IMPRESSION: 1. Mild perivesicular inflammatory stranding as well as infiltration of the periprostatic tissues which may relate to changes of underlying cystoprostatitis. Correlation with urinalysis and urine culture may be helpful. Electronically Signed   By: Dorethia Molt M.D.   On: 07/28/2023 03:43     Labs:   Basic Metabolic Panel: Recent Labs  Lab 07/26/23 1243 07/27/23 2339 07/28/23 1452 07/29/23 0528  NA 136 136  --  140  K 4.0 3.9  --  3.8  CL 100 101  --  105  CO2 22 23  --  25  GLUCOSE 119* 119*  --  107*  BUN 11 10  --  12  CREATININE 1.00 1.02 0.91 0.76  CALCIUM 9.7 9.2  --  8.6*   GFR Estimated Creatinine Clearance: 139.1 mL/min (by C-G formula based on SCr of 0.76 mg/dL). Liver Function Tests: Recent Labs  Lab 07/26/23 1243  AST 22  ALT 25  ALKPHOS 70   BILITOT 1.3*  PROT 8.0  ALBUMIN 4.4   No results for input(s): LIPASE, AMYLASE in the last 168 hours. No results for input(s): AMMONIA in the last 168 hours. Coagulation profile No results for input(s): INR, PROTIME in the last 168 hours.  CBC: Recent Labs  Lab 07/26/23 1243 07/27/23 2339 07/28/23 1452 07/29/23 0528  WBC 16.0* 8.0 4.9 3.8*  NEUTROABS 13.3*  --   --   --   HGB 13.7 13.5 12.3* 12.9*  HCT 41.0 39.8 36.9* 39.5  MCV 88.6 89.0 89.8 90.8  PLT 259 241 245 267   Cardiac Enzymes: No results for input(s): CKTOTAL, CKMB, CKMBINDEX, TROPONINI in the last 168 hours. BNP: Invalid input(s): POCBNP CBG: No results for input(s): GLUCAP in the last 168 hours. D-Dimer No results for input(s): DDIMER in the last 72 hours. Hgb A1c No results for input(s): HGBA1C in the last 72 hours. Lipid Profile No results for input(s): CHOL, HDL, LDLCALC, TRIG, CHOLHDL, LDLDIRECT in the last 72 hours. Thyroid function studies No results for input(s): TSH, T4TOTAL, T3FREE, THYROIDAB in the last 72 hours.  Invalid input(s): FREET3 Anemia work up No results for input(s): VITAMINB12, FOLATE, FERRITIN, TIBC, IRON, RETICCTPCT in the last 72 hours. Microbiology Recent Results (from the past 240 hours)  Urine Culture     Status: Abnormal   Collection Time: 07/26/23 12:43 PM   Specimen: Urine, Random  Result Value Ref Range Status   Specimen Description   Final    URINE, RANDOM Performed at [facility], [address], [place], [place] [address]    Special Requests   Final    NONE Reflexed from [id number] Performed at [facility], [address] [place], [place] [address]    Culture 60,000 COLONIES/mL ESCHERICHIA COLI (A)  Final   Report Status 07/28/2023 FINAL  Final   Organism ID, Bacteria ESCHERICHIA COLI (A)  Final      Susceptibility   Escherichia coli - MIC*    AMPICILLIN >=32  RESISTANT Resistant     CEFAZOLIN <=4 SENSITIVE Sensitive     CEFEPIME <=0.12 SENSITIVE Sensitive     CEFTRIAXONE <=0.25  SENSITIVE Sensitive     CIPROFLOXACIN  <=0.25 SENSITIVE Sensitive     GENTAMICIN <=1 SENSITIVE Sensitive     IMIPENEM <=0.25 SENSITIVE Sensitive     NITROFURANTOIN <=16 SENSITIVE Sensitive     TRIMETH/SULFA <=20 SENSITIVE Sensitive     AMPICILLIN/SULBACTAM 4 SENSITIVE Sensitive     PIP/TAZO <=4 SENSITIVE Sensitive ug/mL    * 60,000 COLONIES/mL ESCHERICHIA COLI  Blood culture (routine x 2)     Status: None (Preliminary result)   Collection Time: 07/26/23 12:43 PM   Specimen: BLOOD  Result Value Ref Range Status   Specimen Description   Final    BLOOD LEFT ANTECUBITAL Performed at [facility], [address], [place], [place] [address]    Special Requests   Final    BOTTLES DRAWN AEROBIC AND ANAEROBIC Blood Culture adequate volume Performed at [facility], [address], [place], [place] [address]    Culture   Final    NO GROWTH 4 DAYS Performed at [facility], [address] [place], [place] [address]    Report Status PENDING  Incomplete  Blood culture (routine x 2)     Status: None (Preliminary result)   Collection Time: 07/26/23  3:55 PM   Specimen: BLOOD  Result Value Ref Range Status   Specimen Description   Final    BLOOD LEFT ANTECUBITAL Performed at [facility], [address], [place], [place] [address]    Special Requests   Final    BOTTLES DRAWN AEROBIC AND ANAEROBIC Blood Culture adequate volume Performed at [facility], [address] [place], [place] [address]    Culture   Final    NO GROWTH 4 DAYS Performed at [facility], [address], [place], [place] [address]    Report Status PENDING  Incomplete  Urine Culture     Status: None   Collection Time: 07/28/23 12:38 AM   Specimen: Urine, Clean Catch  Result Value Ref Range Status    Specimen Description   Final    URINE, CLEAN CATCH Performed at [facility], [address], [place], [place] [address]    Special Requests   Final    NONE Performed at [facility], [address], [place], [place] [address]    Culture   Final    NO GROWTH Performed at [facility], [address] [place], [place] [address]    Report Status 07/29/2023 FINAL  Final  Blood culture (routine x 2)     Status: None (Preliminary result)   Collection Time: 07/28/23  4:02 AM   Specimen: BLOOD  Result Value Ref Range Status   Specimen Description   Final    BLOOD RIGHT ANTECUBITAL Performed at [facility], [address], [place], [place] [address]    Special Requests   Final    BOTTLES DRAWN AEROBIC AND ANAEROBIC Blood Culture results may not be optimal due to an inadequate volume of blood received in culture bottles Performed at [facility], [address] [place], [place] [address]    Culture   Final    NO GROWTH 2 DAYS Performed at [facility], [address], [place], [place] [address]    Report Status PENDING  Incomplete  Blood culture (routine x 2)     Status: None (Preliminary result)   Collection Time: 07/28/23  4:07 AM   Specimen: BLOOD  Result Value Ref Range Status   Specimen Description  Final    BLOOD BLOOD LEFT WRIST Performed at [facility], [address] [place], [place] [address]    Special Requests   Final    BOTTLES DRAWN AEROBIC AND ANAEROBIC Blood Culture adequate volume Performed at [facility], [address], [place], [place] [address]    Culture   Final    NO GROWTH 2 DAYS Performed at [facility], [address], [place], [place] [address]    Report Status PENDING  Incomplete    Time coordinating discharge: 45 minutes  Signed: Kuulei Kleier  [facility] 07/30/2023, 3:03 PM

## 2023-07-31 LAB — CULTURE, BLOOD (ROUTINE X 2)
Culture: NO GROWTH
Culture: NO GROWTH
Special Requests: ADEQUATE
Special Requests: ADEQUATE

## 2023-08-02 LAB — CULTURE, BLOOD (ROUTINE X 2)
Culture: NO GROWTH
Culture: NO GROWTH
Special Requests: ADEQUATE
